# Patient Record
Sex: Male | Born: 1955
Health system: Southern US, Community
[De-identification: ages and names within clinical notes are randomized; demographics above are authoritative.]

## PROBLEM LIST (undated history)

## (undated) DIAGNOSIS — I119 Hypertensive heart disease without heart failure: Secondary | ICD-10-CM

## (undated) DIAGNOSIS — E785 Hyperlipidemia, unspecified: Secondary | ICD-10-CM

## (undated) DIAGNOSIS — G4733 Obstructive sleep apnea (adult) (pediatric): Secondary | ICD-10-CM

## (undated) DIAGNOSIS — Z87828 Personal history of other (healed) physical injury and trauma: Secondary | ICD-10-CM

## (undated) DIAGNOSIS — K219 Gastro-esophageal reflux disease without esophagitis: Secondary | ICD-10-CM

## (undated) DIAGNOSIS — E119 Type 2 diabetes mellitus without complications: Secondary | ICD-10-CM

## (undated) DIAGNOSIS — I2510 Atherosclerotic heart disease of native coronary artery without angina pectoris: Secondary | ICD-10-CM

## (undated) DIAGNOSIS — M1990 Unspecified osteoarthritis, unspecified site: Secondary | ICD-10-CM

## (undated) DIAGNOSIS — Z9989 Dependence on other enabling machines and devices: Secondary | ICD-10-CM

## (undated) HISTORY — DX: Personal history of other (healed) physical injury and trauma: Z87.828

## (undated) HISTORY — DX: Type 2 diabetes mellitus without complications: E11.9

## (undated) HISTORY — DX: Hyperlipidemia, unspecified: E78.5

## (undated) HISTORY — PX: REPLACEMENT TOTAL KNEE: SUR1224

---

## 1979-01-02 HISTORY — PX: BELOW KNEE LEG AMPUTATION: SUR23

## 1989-01-01 HISTORY — PX: ELBOW SURGERY: SHX618

## 2002-02-08 ENCOUNTER — Emergency Department (HOSPITAL_COMMUNITY): Admission: EM | Admit: 2002-02-08 | Discharge: 2002-02-08 | Payer: Self-pay | Admitting: Emergency Medicine

## 2006-10-04 ENCOUNTER — Ambulatory Visit (HOSPITAL_COMMUNITY): Admission: RE | Admit: 2006-10-04 | Discharge: 2006-10-04 | Payer: Self-pay | Admitting: Orthopaedic Surgery

## 2006-10-11 ENCOUNTER — Ambulatory Visit (HOSPITAL_COMMUNITY): Admission: RE | Admit: 2006-10-11 | Discharge: 2006-10-11 | Payer: Self-pay | Admitting: Orthopaedic Surgery

## 2009-07-08 ENCOUNTER — Emergency Department (HOSPITAL_COMMUNITY): Admission: EM | Admit: 2009-07-08 | Discharge: 2009-07-08 | Payer: Self-pay | Admitting: Emergency Medicine

## 2009-07-10 ENCOUNTER — Emergency Department (HOSPITAL_COMMUNITY): Admission: EM | Admit: 2009-07-10 | Discharge: 2009-07-10 | Payer: Self-pay | Admitting: Emergency Medicine

## 2010-05-24 ENCOUNTER — Encounter: Payer: Self-pay | Admitting: Orthopaedic Surgery

## 2010-07-16 ENCOUNTER — Encounter (HOSPITAL_COMMUNITY): Payer: Self-pay

## 2010-07-16 ENCOUNTER — Ambulatory Visit (HOSPITAL_COMMUNITY)
Admission: RE | Admit: 2010-07-16 | Discharge: 2010-07-16 | Disposition: A | Discharge status: Home / Self Care | Payer: Medicaid Other | Source: Ambulatory Visit | Attending: Internal Medicine | Admitting: Internal Medicine

## 2010-07-16 ENCOUNTER — Other Ambulatory Visit (HOSPITAL_COMMUNITY): Payer: Self-pay | Admitting: Internal Medicine

## 2010-07-16 DIAGNOSIS — R0602 Shortness of breath: Secondary | ICD-10-CM | POA: Insufficient documentation

## 2010-07-16 DIAGNOSIS — R05 Cough: Secondary | ICD-10-CM

## 2010-07-16 DIAGNOSIS — R059 Cough, unspecified: Secondary | ICD-10-CM | POA: Insufficient documentation

## 2010-07-19 ENCOUNTER — Emergency Department (HOSPITAL_COMMUNITY): Payer: Medicaid Other

## 2010-07-19 ENCOUNTER — Emergency Department (HOSPITAL_COMMUNITY)
Admission: EM | Admit: 2010-07-19 | Discharge: 2010-07-19 | Disposition: A | Discharge status: Home / Self Care | Payer: Medicaid Other | Attending: Emergency Medicine | Admitting: Emergency Medicine

## 2010-07-19 DIAGNOSIS — R05 Cough: Secondary | ICD-10-CM | POA: Insufficient documentation

## 2010-07-19 DIAGNOSIS — R059 Cough, unspecified: Secondary | ICD-10-CM | POA: Insufficient documentation

## 2010-07-19 DIAGNOSIS — J209 Acute bronchitis, unspecified: Secondary | ICD-10-CM | POA: Insufficient documentation

## 2010-07-19 LAB — DIFFERENTIAL
Basophils Absolute: 0 10*3/uL (ref 0.0–0.1)
Eosinophils Absolute: 0.1 10*3/uL (ref 0.0–0.7)
Lymphocytes Relative: 14 % (ref 12–46)
Neutro Abs: 9.1 10*3/uL — ABNORMAL HIGH (ref 1.7–7.7)
Neutrophils Relative %: 77 % (ref 43–77)

## 2010-07-19 LAB — GLUCOSE, CAPILLARY: Glucose-Capillary: 174 mg/dL — ABNORMAL HIGH (ref 70–99)

## 2010-07-19 LAB — CBC
MCHC: 34.8 g/dL (ref 30.0–36.0)
MCV: 85.7 fL (ref 78.0–100.0)
RDW: 12.7 % (ref 11.5–15.5)

## 2010-07-19 LAB — BASIC METABOLIC PANEL
Calcium: 8.3 mg/dL — ABNORMAL LOW (ref 8.4–10.5)
Chloride: 94 mEq/L — ABNORMAL LOW (ref 96–112)
Creatinine, Ser: 0.88 mg/dL (ref 0.4–1.5)
Sodium: 132 mEq/L — ABNORMAL LOW (ref 135–145)
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
## 2010-09-15 NOTE — Op Note (Signed)
NAME:  Carl Mccoy, Carl Mccoy NO.:  [id number]   MEDICAL RECORD NO.:  [id number]          PATIENT TYPE:  AMB   LOCATION:  DAY                           FACILITY:  [facility]   PHYSICIAN:  J. Darreld Mclean, M.D. DATE OF BIRTH:  1955/11/14   DATE OF PROCEDURE:  10/11/2006  DATE OF DISCHARGE:                               OPERATIVE REPORT   PREOPERATIVE DIAGNOSIS:  Tear left the medial meniscus.   POSTOPERATIVE DIAGNOSES:  1. Tear left the medial meniscus.  2. Degenerative joint disease.   PROCEDURE:  Operative arthroscopy of left knee with partial medial  meniscectomy.   ANESTHESIA:  General.   TOURNIQUET TIME:  25 minutes.   DRAINS:  None   SURGEON:  J. Darreld Mclean, M.D.   INDICATIONS:  The patient is a 55 year old male with pain and tenderness  in his left knee with giving way.  MRI  shows a tear of the posterior  horn of the medial meniscus.  He also has degenerative joint disease.  The patient is status post below-the-knee amputation on the right from  previous injury, many years ago.  The risks and imponderables of the  procedure are discussed with the patient, preoperatively, and he  appeared to understand and agreed to the procedure as outlined.   DESCRIPTION OF PROCEDURE:  The patient was seen in the holding area.  The left knee was then described at the correct surgical site.  I placed  a mark on his left knee, and he placed on his mark on his left knee.  I  talked to his family.  He was brought to the operating room and placed  supine on the operating room table.  He was given general anesthesia.  The leg holder and tourniquet placed, deflated left upper thigh.  The  patient prepped and draped in the usual manner.  We had a timeout  identifying Carl Mccoy as the patient and the left knee as the correct  surgical site.   Leg was elevated, wrapped circumferentially with esmarch bandage, and  tourniquet was inflated to 350 mmHg and esmarch bandage removed.   Inflow  cannula was inserted and lactated ringers instilled into the knee  medially.  Arthroscope inserted laterally, and the knee was  systematically examined.  Findings were suprapatellar pouch looked  normal; there were some grade 2 changes around the patellofemoral joint.  Medially he had grade 3-4 changes, particularly on the tear of the  posterior horn of the medial meniscus.  Anterior cruciate was loose, but  intact.  Laterally he had grade 2 changes.  The meniscus was intact  laterally.   Attention was directed to either side of the knee.  Using a meniscal  shaver and meniscal punch a good smooth contour was obtained.  Permanent  pictures were taken throughout the case.  Again, it should be noted that  he had some eburnation of bone and grade 4 changes in certain areas of  the tibial plateau posteriorly and on the left medially.  The knee was  irrigated with remaining part of lactated ringers.  The wound  was  reapproximated using 3-0  nylon in an interrupted vertical mattress  manner.  Marcaine 0.25% instilled in each portal.  Tourniquet deflated  after 25 minutes.  Sterile dressing applied, bulky dressing applied,  knee immobilizer applied.  Patient stood procedure well; and went to  recovery in good condition.  Given Percocet 7.5 for pain.  I will see  him in the office in approximately 10 days to 2 weeks.  Physical therapy  has been arranged.  If any difficulty he is to contact me as stated.           ______________________________  J. Darreld Mclean, M.D.     JWK/MEDQ  D:  10/11/2006  T:  10/11/2006  Job:  [id number]

## 2011-02-18 LAB — DIFFERENTIAL
Basophils Relative: 1
Eosinophils Relative: 4
Lymphocytes Relative: 14
Lymphs Abs: 1.2
Monocytes Absolute: 0.6
Monocytes Relative: 7

## 2011-02-18 LAB — URINALYSIS, ROUTINE W REFLEX MICROSCOPIC
Hgb urine dipstick: NEGATIVE
Protein, ur: NEGATIVE
Urobilinogen, UA: 0.2

## 2011-02-18 LAB — COMPREHENSIVE METABOLIC PANEL
Albumin: 3.5
BUN: 29 — ABNORMAL HIGH
Calcium: 8.9
GFR calc Af Amer: >60
Glucose, Bld: 167 — ABNORMAL HIGH
Sodium: 136
Total Protein: 6.2

## 2011-02-18 LAB — CBC
HCT: 39.4
Hemoglobin: 13.7
MCV: 84.3
RBC: 4.68
WBC: 8.6

## 2011-06-29 ENCOUNTER — Emergency Department (HOSPITAL_COMMUNITY): Payer: Medicare Other

## 2011-06-29 ENCOUNTER — Other Ambulatory Visit: Payer: Self-pay

## 2011-06-29 ENCOUNTER — Emergency Department (HOSPITAL_COMMUNITY)
Admission: EM | Admit: 2011-06-29 | Discharge: 2011-06-29 | Discharge status: Against Medical Advice | Payer: Medicare Other | Attending: Emergency Medicine | Admitting: Emergency Medicine

## 2011-06-29 ENCOUNTER — Encounter (HOSPITAL_COMMUNITY): Payer: Self-pay | Admitting: *Deleted

## 2011-06-29 DIAGNOSIS — E119 Type 2 diabetes mellitus without complications: Secondary | ICD-10-CM | POA: Insufficient documentation

## 2011-06-29 DIAGNOSIS — I10 Essential (primary) hypertension: Secondary | ICD-10-CM | POA: Insufficient documentation

## 2011-06-29 DIAGNOSIS — R079 Chest pain, unspecified: Secondary | ICD-10-CM | POA: Insufficient documentation

## 2011-06-29 LAB — CBC
HCT: 44.5 % (ref 39.0–52.0)
Hemoglobin: 15.1 g/dL (ref 13.0–17.0)
MCH: 29.7 pg (ref 26.0–34.0)
RBC: 5.08 MIL/uL (ref 4.22–5.81)
RDW: 12.5 % (ref 11.5–15.5)
WBC: 5.2 10*3/uL (ref 4.0–10.5)

## 2011-06-29 LAB — COMPREHENSIVE METABOLIC PANEL
AST: 16 U/L (ref 0–37)
CO2: 30 mEq/L (ref 19–32)
Chloride: 101 mEq/L (ref 96–112)
Glucose, Bld: 222 mg/dL — ABNORMAL HIGH (ref 70–99)

## 2011-06-29 LAB — POCT I-STAT TROPONIN I: Troponin i, poc: 0.02 ng/mL (ref 0.00–0.08)

## 2011-06-29 LAB — DIFFERENTIAL
Basophils Absolute: 0 10*3/uL (ref 0.0–0.1)
Basophils Relative: 1 % (ref 0–1)
Eosinophils Absolute: 0.3 10*3/uL (ref 0.0–0.7)
Eosinophils Relative: 6 % — ABNORMAL HIGH (ref 0–5)
Neutro Abs: 2.6 10*3/uL (ref 1.7–7.7)

## 2011-06-29 MED ORDER — NITROGLYCERIN 0.4 MG SL SUBL
0.4000 mg | SUBLINGUAL_TABLET | SUBLINGUAL | Status: DC | PRN
  Administered 2011-06-29: 0.4 mg via SUBLINGUAL
  Filled 2011-06-29: qty 25

## 2011-06-29 MED ORDER — ASPIRIN 81 MG PO CHEW
324.0000 mg | CHEWABLE_TABLET | Freq: Once | ORAL | Status: AC
  Administered 2011-06-29: 324 mg via ORAL
  Filled 2011-06-29: qty 4

## 2011-06-29 NOTE — ED Provider Notes (Signed)
History     CSN: [id number]  Arrival date & time 06/29/11  0450   First MD Initiated Contact with Patient 06/29/11 0502      Chief Complaint  Patient presents with  . Chest Pain    (Consider location/radiation/quality/duration/timing/severity/associated sxs/prior treatment) Patient is a 56 y.o. male presenting with chest pain. The history is provided by the patient (the patient complains of chest tightness since 1 AM today mild shortness of breath no sweating.). No language interpreter was used.  Chest Pain The chest pain began 3 - 5 hours ago. Chest pain occurs constantly. The chest pain is unchanged. Associated with: nothing. At its most intense, the pain is at 7/10. The pain is currently at 4/10. The severity of the pain is moderate. The quality of the pain is described as aching. The pain does not radiate. Exacerbated by: nothing. Pertinent negatives for primary symptoms include no fever, no fatigue, no syncope, no cough and no abdominal pain.  Pertinent negatives for associated symptoms include no claudication.  Pertinent negatives for past medical history include no seizures.     Past Medical History  Diagnosis Date  . Diabetes mellitus   . Hypertension     Past Surgical History  Procedure Date  . Below knee leg amputation     right BKA    No family history on file.  History  Substance Use Topics  . Smoking status: Former Games developer  . Smokeless tobacco: Not on file  . Alcohol Use: No      Review of Systems  Constitutional: Negative for fever and fatigue.  HENT: Negative for congestion, sinus pressure and ear discharge.   Eyes: Negative for discharge.  Respiratory: Negative for cough.   Cardiovascular: Positive for chest pain. Negative for claudication and syncope.  Gastrointestinal: Negative for abdominal pain and diarrhea.  Genitourinary: Negative for frequency and hematuria.  Musculoskeletal: Negative for back pain.  Skin: Negative for rash.  Neurological:  Negative for seizures and headaches.  Hematological: Negative.   Psychiatric/Behavioral: Negative for hallucinations.    Allergies  Review of patient's allergies indicates no known allergies.  Home Medications   Current Outpatient Rx  Name Route Sig Dispense Refill  . METFORMIN HCL 500 MG PO TABS Oral Take 500 mg by mouth 2 (two) times daily with a meal.    . UNKNOWN TO PATIENT  Blood pressure medication-pt does not know name or dose.      BP 111/61  Pulse 62  Temp(Src) 97.7 F (36.5 C) (Oral)  Resp 16  Ht 5\' 11"  (1.803 m)  Wt 260 lb (117.935 kg)  BMI 36.26 kg/m2  SpO2 96%  Physical Exam  Constitutional: He is oriented to person, place, and time. He appears well-developed.  HENT:  Head: Normocephalic and atraumatic.  Eyes: Conjunctivae and EOM are normal. No scleral icterus.  Neck: Neck supple. No thyromegaly present.  Cardiovascular: Normal rate and regular rhythm.  Exam reveals no gallop and no friction rub.   No murmur heard. Pulmonary/Chest: No stridor. He has no wheezes. He has no rales. He exhibits no tenderness.  Abdominal: He exhibits no distension. There is no tenderness. There is no rebound.  Musculoskeletal: Normal range of motion. He exhibits no edema.  Lymphadenopathy:    He has no cervical adenopathy.  Neurological: He is oriented to person, place, and time. Coordination normal.  Skin: No rash noted. No erythema.  Psychiatric: He has a normal mood and affect. His behavior is normal.    ED Course  Procedures (including critical care time)  Labs Reviewed  DIFFERENTIAL - Abnormal; Notable for the following:    Eosinophils Relative 6 (*)    All other components within normal limits  COMPREHENSIVE METABOLIC PANEL - Abnormal; Notable for the following:    Potassium 3.4 (*)    Glucose, Bld 222 (*)    Total Bilirubin 0.2 (*)    All other components within normal limits  CBC  POCT I-STAT TROPONIN I   Dg Chest Port 1 View  06/29/2011  *RADIOLOGY REPORT*   Clinical Data: Chest pain.  PORTABLE CHEST - 1 VIEW  Comparison: Chest radiograph performed 07/19/2010  Findings: The lungs are well-aerated.  Minimal bibasilar airspace opacities likely reflect atelectasis.  There is no evidence of pleural effusion or pneumothorax.  The cardiomediastinal silhouette is borderline normal in size.  No acute osseous abnormalities are seen.  IMPRESSION: Minimal bibasilar airspace opacities likely reflect atelectasis; lungs otherwise clear.  Original Report Authenticated By: Tonia Ghent, M.D.     1. Chest pain      Date: 06/29/2011  Rate:66  Rhythm: normal sinus rhythm  QRS Axis: left  Intervals: normal  ST/T Wave abnormalities: nonspecific ST changes  Pt has new inverted t waves in lead 3  Conduction Disutrbances:none  Narrative Interpretation:   Old EKG Reviewed: changes noted  The patient was told that the pain he had very well could be from his heart. He was told he should be admitted to the hospital for further testing. He was told he may have a heart attack. The patient understood this and decided he wanted to leave AMA  MDM  Chest pain possibly cardiac        Benny Lennert, MD 06/29/11 [phone]

## 2011-06-29 NOTE — ED Notes (Signed)
Administered one nitroglycerin tablet 0.4mg  SL, patient stated he is no longer in any pain.

## 2011-06-29 NOTE — ED Notes (Signed)
C/o left-sided chest pressure onset this morning approx 0100; states this awoke him from sleep.

## 2011-06-29 NOTE — Discharge Instructions (Signed)
Return to the hospital if any more discomfort.  See your md this week.

## 2011-07-22 NOTE — H&P (Signed)
  NTS SOAP Note  Vital Signs:  Vitals as of: 07/22/2011: Systolic 139: Diastolic 79: Heart Rate 69: Temp 96.81F: Height 62ft 11in: Weight 249Lbs 0 Ounces: OFC 0in: Respiratory Rate 0: O2 Saturation 0: Pain Level 0: BMI 35  BMI : 34.73 kg/m2  Subjective: This 80 Years 80 Months old Male presents forscreening TCS.  Denies any GI complaints.  No family h/o colon carcinoma.  Review of Symptoms:  Constitutional:unremarkable Head:unremarkable Eyes:unremarkable Nose/Mouth/Throat:unremarkable Cardiovascular:unremarkable Respiratory:dyspnea Gastrointestinal:unremarkable Genitourinary:frequency joint, neck pain Skin:unremarkable Hematolgic/Lymphatic:unremarkable Allergic/Immunologic:unremarkable   Past Medical History:Reviewed   Past Medical History  Surgical History: left leg amputation Medical Problems:  Diabetes, High Blood pressure Allergies: nkda Medications: metformin, glipizide, lisinopril   Social History:Reviewed   Social History  Preferred Language: English ([place]) Race:  White Ethnicity: Not Hispanic / Latino Age: 53 Years 3 Months Marital Status:  M Alcohol: unknown Recreational drug(s):  No   Smoking Status: Former smoker reviewed on 07/22/2011 Started Date: 05/03/1974 Stopped Date: 05/03/1998   Family History:Reviewed   Family History  Is there a family history WU:[id number], HTN    Objective Information: General:Well appearing, well nourished in no distress. Head:Atraumatic; no masses; no abnormalities Heart:RRR, no murmur or gallop.  Normal S1, S2.  No S3, S4.  Lungs:CTA bilaterally, no wheezes, rhonchi, rales.  Breathing unlabored. Abdomen:Soft, NT/ND, no HSM, no masses. deferred to procedure  Assessment:Need for screening TCS  Diagnosis &amp; Procedure: DiagnosisCode: V76.51, ProcedureCode: 82956,    Plan:Scheduled for TCS on 08/10/11.   Patient  Education:Alternative treatments to surgery were discussed with patient (and family).Risks and benefits  of procedure were fully explained to the patient (and family) who gave informed consent. Patient/family questions were addressed.  Follow-up:Pending Surgery

## 2011-08-06 ENCOUNTER — Encounter (HOSPITAL_COMMUNITY): Payer: Self-pay

## 2011-08-09 MED ORDER — SODIUM CHLORIDE 0.45 % IV SOLN
Freq: Once | INTRAVENOUS | Status: AC
  Administered 2011-08-10: 09:00:00 via INTRAVENOUS

## 2011-08-10 ENCOUNTER — Encounter (HOSPITAL_COMMUNITY)
Admission: RE | Disposition: A | Discharge status: Home / Self Care | Payer: Self-pay | Source: Ambulatory Visit | Attending: General Surgery

## 2011-08-10 ENCOUNTER — Ambulatory Visit (HOSPITAL_COMMUNITY)
Admission: RE | Admit: 2011-08-10 | Discharge: 2011-08-10 | Disposition: A | Discharge status: Home / Self Care | Payer: Medicare Other | Source: Ambulatory Visit | Attending: General Surgery | Admitting: General Surgery

## 2011-08-10 ENCOUNTER — Encounter (HOSPITAL_COMMUNITY): Payer: Self-pay | Admitting: *Deleted

## 2011-08-10 DIAGNOSIS — Z1211 Encounter for screening for malignant neoplasm of colon: Secondary | ICD-10-CM | POA: Insufficient documentation

## 2011-08-10 DIAGNOSIS — I10 Essential (primary) hypertension: Secondary | ICD-10-CM | POA: Insufficient documentation

## 2011-08-10 DIAGNOSIS — K5730 Diverticulosis of large intestine without perforation or abscess without bleeding: Secondary | ICD-10-CM | POA: Insufficient documentation

## 2011-08-10 DIAGNOSIS — Z79899 Other long term (current) drug therapy: Secondary | ICD-10-CM | POA: Insufficient documentation

## 2011-08-10 DIAGNOSIS — E119 Type 2 diabetes mellitus without complications: Secondary | ICD-10-CM | POA: Insufficient documentation

## 2011-08-10 DIAGNOSIS — D126 Benign neoplasm of colon, unspecified: Secondary | ICD-10-CM | POA: Insufficient documentation

## 2011-08-10 HISTORY — PX: COLONOSCOPY: SHX5424

## 2011-08-10 LAB — GLUCOSE, CAPILLARY: Glucose-Capillary: 147 mg/dL — ABNORMAL HIGH (ref 70–99)

## 2011-08-10 SURGERY — COLONOSCOPY
Anesthesia: Moderate Sedation

## 2011-08-10 MED ORDER — MIDAZOLAM HCL 5 MG/5ML IJ SOLN
INTRAMUSCULAR | Status: AC
  Filled 2011-08-10: qty 10

## 2011-08-10 MED ORDER — STERILE WATER FOR IRRIGATION IR SOLN
Status: DC | PRN
  Administered 2011-08-10: 09:00:00

## 2011-08-10 MED ORDER — MIDAZOLAM HCL 5 MG/5ML IJ SOLN
INTRAMUSCULAR | Status: DC | PRN
  Administered 2011-08-10: 4 mg via INTRAVENOUS

## 2011-08-10 MED ORDER — MEPERIDINE HCL 100 MG/ML IJ SOLN
INTRAMUSCULAR | Status: AC
  Filled 2011-08-10: qty 1

## 2011-08-10 MED ORDER — MEPERIDINE HCL 25 MG/ML IJ SOLN
INTRAMUSCULAR | Status: DC | PRN
  Administered 2011-08-10: 50 mg via INTRAVENOUS

## 2011-08-10 NOTE — Interval H&P Note (Signed)
History and Physical Interval Note:  08/10/2011 9:10 AM  Carl Mccoy  has presented today for surgery, with the diagnosis of Special screening for malignant neoplasms, colon   The various methods of treatment have been discussed with the patient and family. After consideration of risks, benefits and other options for treatment, the patient has consented to  Procedure(s) (LRB): COLONOSCOPY (N/A) as a surgical intervention .  The patients' history has been reviewed, patient examined, no change in status, stable for surgery.  I have reviewed the patients' chart and labs.  Questions were answered to the patient's satisfaction.     Franky Macho A

## 2011-08-10 NOTE — Op Note (Signed)
[facility] [address] [place], [place]  [address]  COLONOSCOPY PROCEDURE REPORT  PATIENT:  Carl Mccoy, Carl Mccoy  MR#:  [id number] BIRTHDATE:  02/13/1956, 55 yrs. old  GENDER:  male ENDOSCOPIST:  Franky Macho, MD REF. BY:  Karleen Hampshire, M.D. PROCEDURE DATE:  08/10/2011 PROCEDURE:  Colonoscopy with snare polypectomy ASA CLASS:  Class II INDICATIONS:  Screening MEDICATIONS:   Versed 4 mg IV, demerol 50 mg IV  DESCRIPTION OF PROCEDURE:   After the risks benefits and alternatives of the procedure were thoroughly explained, informed consent was obtained.  Digital rectal exam was performed and revealed no abnormalities.   The [id number] ([id number]) endoscope was introduced through the anus and advanced to the cecum, which was identified by both the appendix and ileocecal valve, without limitations.  The quality of the prep was adequate..  The instrument was then slowly withdrawn as the colon was fully examined. <<PROCEDUREIMAGES>>  FINDINGS:  Mild diverticulosis was found in the sigmoid colon.  A sessile polyp was found in the sigmoid colon, 25cm from the anus. (see image001 and image002).   Removed and fulgerated with snare cautery. No specimen was retrieved.  Retroflexed views in the rectum revealed no abnormalities.  The scope was then withdrawn from the cecum and the procedure completed. COMPLICATIONS:  None ENDOSCOPIC IMPRESSION: 1) Mild diverticulosis in the sigmoid colon 2) Sessile polyp in the sigmoid colon 3) Normal colonoscopy otherwise RECOMMENDATIONS:  REPEAT EXAM:  In 3 year(s) for Colonoscopy.  ______________________________ Franky Macho, MD  CC:  Karleen Hampshire, MD  n. Rosalie DoctorFranky Macho at 08/10/2011 09:31 AM  Sheryle Spray, [phone]

## 2011-08-10 NOTE — Discharge Instructions (Signed)
Colonoscopy Care After Read the instructions outlined below and refer to this sheet in the next few weeks. These discharge instructions provide you with general information on caring for yourself after you leave the hospital. Your doctor may also give you specific instructions. While your treatment has been planned according to the most current medical practices available, unavoidable complications occasionally occur. If you have any problems or questions after discharge, call your doctor. HOME CARE INSTRUCTIONS ACTIVITY:  You may resume your regular activity, but move at a slower pace for the next 24 hours.   Take frequent rest periods for the next 24 hours.   Walking will help get rid of the air and reduce the bloated feeling in your belly (abdomen).   No driving for 24 hours (because of the medicine (anesthesia) used during the test).   You may shower.   Do not sign any important legal documents or operate any machinery for 24 hours (because of the anesthesia used during the test).  NUTRITION:  Drink plenty of fluids.   You may resume your normal diet as instructed by your doctor.   Begin with a light meal and progress to your normal diet. Heavy or fried foods are harder to digest and may make you feel sick to your stomach (nauseated).   Avoid alcoholic beverages for 24 hours or as instructed.  MEDICATIONS:  You may resume your normal medications unless your doctor tells you otherwise.  WHAT TO EXPECT TODAY:  Some feelings of bloating in the abdomen.   Passage of more gas than usual.   Spotting of blood in your stool or on the toilet paper.  IF YOU HAD POLYPS REMOVED DURING THE COLONOSCOPY:  No aspirin products for 7 days or as instructed.   No alcohol for 7 days or as instructed.   Eat a soft diet for the next 24 hours.  FINDING OUT THE RESULTS OF YOUR TEST Not all test results are available during your visit. If your test results are not back during the visit, make an  appointment with your caregiver to find out the results. Do not assume everything is normal if you have not heard from your caregiver or the medical facility. It is important for you to follow up on all of your test results.  SEEK IMMEDIATE MEDICAL CARE IF:  You have more than a spotting of blood in your stool.   Your belly is swollen (abdominal distention).   You are nauseated or vomiting.   You have a fever.   You have abdominal pain or discomfort that is severe or gets worse throughout the day.  Document Released: 12/02/2003 Document Revised: 04/08/2011 Document Reviewed: 11/30/2007 ExitCare Patient Information 2012 [employer].   Diverticulosis Diverticulosis is a common condition that develops when small pouches (diverticula) form in the wall of the colon. The risk of diverticulosis increases with age. It happens more often in people who eat a low-fiber diet. Most individuals with diverticulosis have no symptoms. Those individuals with symptoms usually experience abdominal pain, constipation, or loose stools (diarrhea). HOME CARE INSTRUCTIONS   Increase the amount of fiber in your diet as directed by your caregiver or dietician. This may reduce symptoms of diverticulosis.   Your caregiver may recommend taking a dietary fiber supplement.   Drink at least 6 to 8 glasses of water each day to prevent constipation.   Try not to strain when you have a bowel movement.   Your caregiver may recommend avoiding nuts and seeds to prevent   complications, although this is still an uncertain benefit.   Only take over-the-counter or prescription medicines for pain, discomfort, or fever as directed by your caregiver.  FOODS WITH HIGH FIBER CONTENT INCLUDE:  Fruits. Apple, peach, pear, tangerine, raisins, prunes.   Vegetables. Brussels sprouts, asparagus, broccoli, cabbage, carrot, cauliflower, romaine lettuce, spinach, summer squash, tomato, winter squash, zucchini.   Starchy Vegetables.  Baked beans, kidney beans, lima beans, split peas, lentils, potatoes (with skin).   Grains. Whole wheat bread, brown rice, bran flake cereal, plain oatmeal, white rice, shredded wheat, bran muffins.  SEEK IMMEDIATE MEDICAL CARE IF:   You develop increasing pain or severe bloating.   You have an oral temperature above 102 F (38.9 C), not controlled by medicine.   You develop vomiting or bowel movements that are bloody or black.  Document Released: 01/15/2004 Document Revised: 04/08/2011 Document Reviewed: 09/17/2009 Exit[employer] Patient Information 2012 [employer]. 

## 2011-08-18 ENCOUNTER — Encounter (HOSPITAL_COMMUNITY): Payer: Self-pay | Admitting: General Surgery

## 2011-09-24 ENCOUNTER — Ambulatory Visit (HOSPITAL_COMMUNITY)
Admission: RE | Admit: 2011-09-24 | Discharge: 2011-09-24 | Disposition: A | Discharge status: Home / Self Care | Payer: Medicare Other | Source: Ambulatory Visit | Attending: Internal Medicine | Admitting: Internal Medicine

## 2011-09-24 ENCOUNTER — Other Ambulatory Visit (HOSPITAL_COMMUNITY): Payer: Self-pay | Admitting: Internal Medicine

## 2011-09-24 DIAGNOSIS — R05 Cough: Secondary | ICD-10-CM

## 2011-09-24 DIAGNOSIS — R059 Cough, unspecified: Secondary | ICD-10-CM | POA: Insufficient documentation

## 2012-09-27 ENCOUNTER — Encounter (HOSPITAL_COMMUNITY): Payer: Self-pay | Admitting: *Deleted

## 2012-09-27 ENCOUNTER — Emergency Department (HOSPITAL_COMMUNITY)
Admission: EM | Admit: 2012-09-27 | Discharge: 2012-09-27 | Disposition: A | Discharge status: Home / Self Care | Payer: Medicare Other | Attending: Emergency Medicine | Admitting: Emergency Medicine

## 2012-09-27 DIAGNOSIS — Z87891 Personal history of nicotine dependence: Secondary | ICD-10-CM | POA: Insufficient documentation

## 2012-09-27 DIAGNOSIS — E119 Type 2 diabetes mellitus without complications: Secondary | ICD-10-CM | POA: Insufficient documentation

## 2012-09-27 DIAGNOSIS — Y9389 Activity, other specified: Secondary | ICD-10-CM | POA: Insufficient documentation

## 2012-09-27 DIAGNOSIS — S81009A Unspecified open wound, unspecified knee, initial encounter: Secondary | ICD-10-CM | POA: Insufficient documentation

## 2012-09-27 DIAGNOSIS — S91009A Unspecified open wound, unspecified ankle, initial encounter: Secondary | ICD-10-CM | POA: Insufficient documentation

## 2012-09-27 DIAGNOSIS — I10 Essential (primary) hypertension: Secondary | ICD-10-CM | POA: Insufficient documentation

## 2012-09-27 DIAGNOSIS — Y929 Unspecified place or not applicable: Secondary | ICD-10-CM | POA: Insufficient documentation

## 2012-09-27 DIAGNOSIS — Z79899 Other long term (current) drug therapy: Secondary | ICD-10-CM | POA: Insufficient documentation

## 2012-09-27 DIAGNOSIS — T148XXA Other injury of unspecified body region, initial encounter: Secondary | ICD-10-CM

## 2012-09-27 DIAGNOSIS — W540XXA Bitten by dog, initial encounter: Secondary | ICD-10-CM | POA: Insufficient documentation

## 2012-09-27 LAB — GLUCOSE, CAPILLARY: Glucose-Capillary: 234 mg/dL — ABNORMAL HIGH (ref 70–99)

## 2012-09-27 MED ORDER — AMOXICILLIN-POT CLAVULANATE 875-125 MG PO TABS: 1.0000 | ORAL_TABLET | Freq: Two times a day (BID) | ORAL | Status: DC

## 2012-09-27 MED ORDER — TETANUS-DIPHTH-ACELL PERTUSSIS 5-2.5-18.5 LF-MCG/0.5 IM SUSP
0.5000 mL | Freq: Once | INTRAMUSCULAR | Status: AC
  Administered 2012-09-27: 0.5 mL via INTRAMUSCULAR
  Filled 2012-09-27: qty 0.5

## 2012-09-27 MED ORDER — AMOXICILLIN-POT CLAVULANATE 875-125 MG PO TABS
1.0000 | ORAL_TABLET | Freq: Once | ORAL | Status: AC
  Administered 2012-09-27: 1 via ORAL
  Filled 2012-09-27: qty 1

## 2012-09-27 NOTE — ED Notes (Signed)
Dog bite to right lower leg, occurred ~ 30 min PTA. Animal control is aware.

## 2012-09-27 NOTE — ED Notes (Signed)
CBG of 234  

## 2012-09-27 NOTE — ED Provider Notes (Signed)
History     CSN: [id number]  Arrival date & time 09/27/12  1229   First MD Initiated Contact with Patient 09/27/12 1328      Chief Complaint  Patient presents with  . Animal Bite    (Consider location/radiation/quality/duration/timing/severity/associated sxs/prior treatment) HPI Comments: Carl Mccoy is a 57 y.o. male who presents to the Emergency Department complaining of dog bite to the right lower leg that occurred just PTA.  States that he walked on to the front door of a home and the dog was tried up but bit him.  C/o puncture type wound to the lower leg that occurred through his pants.  He states that animal control was contacted and the dog is allegedly up to date on immunizations.  Patient denies bleeding, numbness or swelling to the extremity.  Pain with weight bearing.  Pt is unsure of last tetanus   The history is provided by the patient.    Past Medical History  Diagnosis Date  . Diabetes mellitus   . Hypertension     Past Surgical History  Procedure Laterality Date  . Below knee leg amputation      right BKA  . Colonoscopy  08/10/2011    Procedure: COLONOSCOPY;  Surgeon: Dalia Heading, MD;  [facility];  Service: Gastroenterology;  Laterality: N/A;    No family history on file.  History  Substance Use Topics  . Smoking status: Former Games developer  . Smokeless tobacco: Not on file  . Alcohol Use: No      Review of Systems  Constitutional: Negative for fever and chills.  Musculoskeletal: Negative for back pain, joint swelling and arthralgias.  Skin: Positive for wound.       Puncture wound   Neurological: Negative for dizziness, weakness and numbness.  Hematological: Does not bruise/bleed easily.  All other systems reviewed and are negative.    Allergies  Review of patient's allergies indicates no known allergies.  Home Medications   Current Outpatient Rx  Name  Route  Sig  Dispense  Refill  . glimepiride (AMARYL) 4 MG tablet   Oral   Take 8 mg by mouth daily.         [name] lisinopril-hydrochlorothiazide (PRINZIDE,ZESTORETIC) 20-12.5 MG per tablet   Oral   Take 1 tablet by mouth daily.         . metFORMIN (GLUCOPHAGE) 500 MG tablet   Oral   Take 500 mg by mouth 2 (two) times daily with a meal.         . oxyCODONE (ROXICODONE) 15 MG immediate release tablet   Oral   Take 15 mg by mouth every 6 (six) hours as needed. pain         . pantoprazole (PROTONIX) 40 MG tablet   Oral   Take 40 mg by mouth daily.           BP 146/117  Pulse 94  Temp(Src) 97.5 F (36.4 C) (Oral)  Resp 20  Ht 5\' 11"  (1.803 m)  Wt 260 lb (117.935 kg)  BMI 36.28 kg/m2  SpO2 99%  Physical Exam  Nursing note and vitals reviewed. Constitutional: He is oriented to person, place, and time. He appears well-developed and well-nourished. No distress.  HENT:  Head: Normocephalic and atraumatic.  Cardiovascular: Normal rate, regular rhythm, normal heart sounds and intact distal pulses.   No murmur heard. Pulmonary/Chest: Effort normal and breath sounds normal. No respiratory distress.  Musculoskeletal: He exhibits no edema and no tenderness.  Neurological: He is alert and oriented to person, place, and time. He exhibits normal muscle tone. Coordination normal.  Skin: Skin is warm. Laceration noted.  Puncture wound to lateral right lower leg.  No bleeding, swelling or obvious injury to deep tissue structures.  Distal sensation intact, DP pulse brisk.      ED Course  Procedures (including critical care time)  Labs Reviewed - No data to display No results found.   Wound was cleaned throughly, TDaP given, augmentin.  Wound was bandaged.     MDM    Officier Manson Passey with [facility] filed a report and confirmed that the dog is UTD on its vaccinations.    Pt agrees to keep wound clean, bandaged and agrees to return here if any signs of infection   Saulo Anthis L. Trisha Mangle, PA-C 10/01/12 1119

## 2012-09-27 NOTE — ED Notes (Signed)
Animal Control contacted to check on status of animal's rabies series. No answer, message left.

## 2012-10-02 NOTE — ED Provider Notes (Signed)
Medical screening examination/treatment/procedure(s) were performed by non-physician practitioner and as supervising physician I was immediately available for consultation/collaboration.  Adhira Jamil, MD 10/02/12 1414 

## 2013-02-01 ENCOUNTER — Encounter: Payer: Self-pay | Admitting: *Deleted

## 2013-02-01 DIAGNOSIS — G8929 Other chronic pain: Secondary | ICD-10-CM | POA: Insufficient documentation

## 2013-02-01 DIAGNOSIS — I10 Essential (primary) hypertension: Secondary | ICD-10-CM | POA: Insufficient documentation

## 2013-02-01 DIAGNOSIS — E119 Type 2 diabetes mellitus without complications: Secondary | ICD-10-CM | POA: Insufficient documentation

## 2013-02-01 DIAGNOSIS — E669 Obesity, unspecified: Secondary | ICD-10-CM

## 2013-02-02 ENCOUNTER — Encounter: Payer: Self-pay | Admitting: Cardiology

## 2013-02-02 ENCOUNTER — Encounter: Payer: Self-pay | Admitting: *Deleted

## 2013-02-02 ENCOUNTER — Ambulatory Visit (INDEPENDENT_AMBULATORY_CARE_PROVIDER_SITE_OTHER): Payer: Medicare Other | Admitting: Cardiology

## 2013-02-02 ENCOUNTER — Other Ambulatory Visit: Payer: Self-pay | Admitting: Cardiology

## 2013-02-02 VITALS — BP 190/103 | HR 88 | Ht 71.5 in | Wt 263.0 lb

## 2013-02-02 DIAGNOSIS — E119 Type 2 diabetes mellitus without complications: Secondary | ICD-10-CM

## 2013-02-02 DIAGNOSIS — R0609 Other forms of dyspnea: Secondary | ICD-10-CM

## 2013-02-02 DIAGNOSIS — Z01810 Encounter for preprocedural cardiovascular examination: Secondary | ICD-10-CM

## 2013-02-02 DIAGNOSIS — I10 Essential (primary) hypertension: Secondary | ICD-10-CM

## 2013-02-02 DIAGNOSIS — R072 Precordial pain: Secondary | ICD-10-CM

## 2013-02-02 DIAGNOSIS — E782 Mixed hyperlipidemia: Secondary | ICD-10-CM

## 2013-02-02 DIAGNOSIS — G4733 Obstructive sleep apnea (adult) (pediatric): Secondary | ICD-10-CM

## 2013-02-02 NOTE — Assessment & Plan Note (Signed)
Progressive as outlined above and also associated with intermittent atypical chest pain. ECG is nonspecific. He has a substantially increased cardiac risk factor profile including impressive family history of premature CAD, type 2 diabetes mellitus, uncontrolled hypertension, and hyperlipidemia. He reports a reassuring stress test a little over a year ago with [facility], records not available. Symptoms have progressed since that time. We discussed the risk and benefits of diagnostic cardiac catheterization and this will be arranged in the near future.

## 2013-02-02 NOTE — Assessment & Plan Note (Signed)
On fenofibrate, high triglycerides noted most recently.

## 2013-02-02 NOTE — Assessment & Plan Note (Signed)
He reports compliance with CPAP. 

## 2013-02-02 NOTE — Assessment & Plan Note (Signed)
Poorly controlled today, complicated by obesity and OSA. He reports compliance with medications. Will likely need further up titration.
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
## 2013-02-02 NOTE — Patient Instructions (Addendum)
Continue all current medications. Your physician has requested that you have a cardiac catheterization. Cardiac catheterization is used to diagnose and/or treat various heart conditions. Doctors may recommend this procedure for a number of different reasons. The most common reason is to evaluate chest pain. Chest pain can be a symptom of coronary artery disease (CAD), and cardiac catheterization can show whether plaque is narrowing or blocking your heart's arteries. This procedure is also used to evaluate the valves, as well as measure the blood flow and oxygen levels in different parts of your heart. For further information please visit www.cardiosmart.org. Please follow instruction sheet, as given. Follow up will be given at time of discharge from procedure.   

## 2013-02-02 NOTE — Assessment & Plan Note (Signed)
On oral therapy, followed by Dr. Regino Schultze.

## 2013-02-06 ENCOUNTER — Encounter (HOSPITAL_COMMUNITY): Payer: Self-pay | Admitting: Pharmacy Technician

## 2013-02-07 NOTE — Addendum Note (Signed)
Addended by: Thompson Grayer on: 02/07/2013 09:59 AM   Modules accepted: Orders

## 2013-02-08 ENCOUNTER — Encounter (HOSPITAL_COMMUNITY): Payer: Self-pay | Admitting: General Practice

## 2013-02-08 ENCOUNTER — Ambulatory Visit (HOSPITAL_COMMUNITY)
Admission: RE | Admit: 2013-02-08 | Discharge: 2013-02-09 | Disposition: A | Discharge status: Home / Self Care | Payer: Medicare Other | Source: Ambulatory Visit | Attending: Cardiovascular Disease | Admitting: Cardiovascular Disease

## 2013-02-08 ENCOUNTER — Encounter (HOSPITAL_COMMUNITY)
Admission: RE | Disposition: A | Discharge status: Home / Self Care | Payer: Self-pay | Source: Ambulatory Visit | Attending: Cardiovascular Disease

## 2013-02-08 DIAGNOSIS — S88119A Complete traumatic amputation at level between knee and ankle, unspecified lower leg, initial encounter: Secondary | ICD-10-CM | POA: Insufficient documentation

## 2013-02-08 DIAGNOSIS — I10 Essential (primary) hypertension: Secondary | ICD-10-CM | POA: Insufficient documentation

## 2013-02-08 DIAGNOSIS — E119 Type 2 diabetes mellitus without complications: Secondary | ICD-10-CM | POA: Insufficient documentation

## 2013-02-08 DIAGNOSIS — I2510 Atherosclerotic heart disease of native coronary artery without angina pectoris: Secondary | ICD-10-CM

## 2013-02-08 DIAGNOSIS — E782 Mixed hyperlipidemia: Secondary | ICD-10-CM | POA: Insufficient documentation

## 2013-02-08 DIAGNOSIS — R404 Transient alteration of awareness: Secondary | ICD-10-CM | POA: Insufficient documentation

## 2013-02-08 DIAGNOSIS — G4733 Obstructive sleep apnea (adult) (pediatric): Secondary | ICD-10-CM | POA: Insufficient documentation

## 2013-02-08 DIAGNOSIS — I209 Angina pectoris, unspecified: Secondary | ICD-10-CM | POA: Insufficient documentation

## 2013-02-08 HISTORY — DX: Morbid (severe) obesity due to excess calories: E66.01

## 2013-02-08 HISTORY — DX: Atherosclerotic heart disease of native coronary artery without angina pectoris: I25.10

## 2013-02-08 HISTORY — DX: Obstructive sleep apnea (adult) (pediatric): G47.33

## 2013-02-08 HISTORY — DX: Gastro-esophageal reflux disease without esophagitis: K21.9

## 2013-02-08 HISTORY — PX: LEFT HEART CATHETERIZATION WITH CORONARY ANGIOGRAM: SHX5451

## 2013-02-08 HISTORY — DX: Unspecified osteoarthritis, unspecified site: M19.90

## 2013-02-08 HISTORY — DX: Dependence on other enabling machines and devices: Z99.89

## 2013-02-08 HISTORY — PX: PERCUTANEOUS CORONARY STENT INTERVENTION (PCI-S): SHX5485

## 2013-02-08 LAB — BASIC METABOLIC PANEL
Calcium: 9.3 mg/dL (ref 8.4–10.5)
GFR calc Af Amer: >90 mL/min (ref >90)
GFR calc non Af Amer: >90 mL/min (ref >90)
Glucose, Bld: 218 mg/dL — ABNORMAL HIGH (ref 70–99)
Potassium: 3.9 mEq/L (ref 3.5–5.1)
Sodium: 137 mEq/L (ref 135–145)

## 2013-02-08 LAB — GLUCOSE, CAPILLARY
Glucose-Capillary: 201 mg/dL — ABNORMAL HIGH (ref 70–99)
Glucose-Capillary: 135 mg/dL — ABNORMAL HIGH (ref 70–99)
Glucose-Capillary: 240 mg/dL — ABNORMAL HIGH (ref 70–99)

## 2013-02-08 LAB — CBC
HCT: 41.9 % (ref 39.0–52.0)
Hemoglobin: 14.9 g/dL (ref 13.0–17.0)
MCH: 30 pg (ref 26.0–34.0)
MCHC: 35.6 g/dL (ref 30.0–36.0)
MCV: 84.5 fL (ref 78.0–100.0)
Platelets: 185 10*3/uL (ref 150–400)
RBC: 4.96 MIL/uL (ref 4.22–5.81)
RDW: 12.8 % (ref 11.5–15.5)
WBC: 5.9 10*3/uL (ref 4.0–10.5)

## 2013-02-08 LAB — PROTIME-INR
INR: 0.96 (ref 0.00–1.49)
Prothrombin Time: 12.6 seconds (ref 11.6–15.2)

## 2013-02-08 SURGERY — LEFT HEART CATHETERIZATION WITH CORONARY ANGIOGRAM
Anesthesia: LOCAL

## 2013-02-08 MED ORDER — SODIUM CHLORIDE 0.9 % IJ SOLN: 3.0000 mL | INTRAMUSCULAR | Status: DC | PRN

## 2013-02-08 MED ORDER — SODIUM CHLORIDE 0.9 % IV SOLN: 250.0000 mL | INTRAVENOUS | Status: DC | PRN

## 2013-02-08 MED ORDER — HYDROCHLOROTHIAZIDE 12.5 MG PO CAPS
12.5000 mg | ORAL_CAPSULE | Freq: Every day | ORAL | Status: DC
  Administered 2013-02-09: 12.5 mg via ORAL
  Filled 2013-02-08: qty 1

## 2013-02-08 MED ORDER — LISINOPRIL-HYDROCHLOROTHIAZIDE 20-12.5 MG PO TABS
1.0000 | ORAL_TABLET | Freq: Every day | ORAL | Status: DC
Start: 2013-02-08 — End: 2013-02-08

## 2013-02-08 MED ORDER — SODIUM CHLORIDE 0.9 % IV SOLN
1.7500 mg/kg/h | INTRAVENOUS | Status: DC
  Filled 2013-02-08: qty 250

## 2013-02-08 MED ORDER — ASPIRIN 325 MG PO TABS
325.0000 mg | ORAL_TABLET | Freq: Every day | ORAL | Status: DC
  Administered 2013-02-09: 10:00:00 325 mg via ORAL
  Filled 2013-02-08 (×2): qty 1

## 2013-02-08 MED ORDER — LIDOCAINE HCL (PF) 1 % IJ SOLN
INTRAMUSCULAR | Status: AC
  Filled 2013-02-08: qty 30

## 2013-02-08 MED ORDER — FENOFIBRATE 160 MG PO TABS
160.0000 mg | ORAL_TABLET | Freq: Every day | ORAL | Status: DC
  Administered 2013-02-08 – 2013-02-09 (×2): 160 mg via ORAL
  Filled 2013-02-08 (×2): qty 1

## 2013-02-08 MED ORDER — OXYCODONE HCL 5 MG PO TABS: 15.0000 mg | ORAL_TABLET | Freq: Four times a day (QID) | ORAL | Status: DC | PRN

## 2013-02-08 MED ORDER — SODIUM CHLORIDE 0.9 % IV SOLN
INTRAVENOUS | Status: DC
  Administered 2013-02-08: 11:00:00 via INTRAVENOUS

## 2013-02-08 MED ORDER — SODIUM CHLORIDE 0.9 % IJ SOLN: 3.0000 mL | Freq: Two times a day (BID) | INTRAMUSCULAR | Status: DC

## 2013-02-08 MED ORDER — ASPIRIN 81 MG PO CHEW
81.0000 mg | CHEWABLE_TABLET | ORAL | Status: AC
  Administered 2013-02-08: 81 mg via ORAL

## 2013-02-08 MED ORDER — FENTANYL CITRATE 0.05 MG/ML IJ SOLN
INTRAMUSCULAR | Status: AC
  Filled 2013-02-08: qty 2

## 2013-02-08 MED ORDER — SODIUM CHLORIDE 0.9 % IV SOLN: 1.0000 mL/kg/h | INTRAVENOUS | Status: AC

## 2013-02-08 MED ORDER — INSULIN ASPART 100 UNIT/ML ~~LOC~~ SOLN
0.0000 [IU] | Freq: Three times a day (TID) | SUBCUTANEOUS | Status: DC
  Administered 2013-02-09: 09:00:00 3 [IU] via SUBCUTANEOUS

## 2013-02-08 MED ORDER — HYDROMORPHONE HCL PF 2 MG/ML IJ SOLN
INTRAMUSCULAR | Status: AC
  Filled 2013-02-08: qty 1

## 2013-02-08 MED ORDER — INSULIN GLARGINE 100 UNIT/ML ~~LOC~~ SOLN
70.0000 [IU] | Freq: Every day | SUBCUTANEOUS | Status: DC
  Administered 2013-02-08: 22:00:00 70 [IU] via SUBCUTANEOUS
  Filled 2013-02-08 (×2): qty 0.7

## 2013-02-08 MED ORDER — ONDANSETRON HCL 4 MG/2ML IJ SOLN: 4.0000 mg | Freq: Four times a day (QID) | INTRAMUSCULAR | Status: DC | PRN

## 2013-02-08 MED ORDER — CLOPIDOGREL BISULFATE 75 MG PO TABS
75.0000 mg | ORAL_TABLET | Freq: Every day | ORAL | Status: DC
  Administered 2013-02-09: 09:00:00 75 mg via ORAL
  Filled 2013-02-08: qty 1

## 2013-02-08 MED ORDER — CLOPIDOGREL BISULFATE 300 MG PO TABS
ORAL_TABLET | ORAL | Status: AC
  Filled 2013-02-08: qty 1

## 2013-02-08 MED ORDER — NITROGLYCERIN 0.2 MG/ML ON CALL CATH LAB
INTRAVENOUS | Status: AC
  Filled 2013-02-08: qty 1

## 2013-02-08 MED ORDER — DIPHENHYDRAMINE HCL 50 MG/ML IJ SOLN
INTRAMUSCULAR | Status: AC
  Filled 2013-02-08: qty 1

## 2013-02-08 MED ORDER — BIVALIRUDIN 250 MG IV SOLR
INTRAVENOUS | Status: AC
  Filled 2013-02-08: qty 250

## 2013-02-08 MED ORDER — MIDAZOLAM HCL 2 MG/2ML IJ SOLN
INTRAMUSCULAR | Status: AC
  Filled 2013-02-08: qty 2

## 2013-02-08 MED ORDER — LISINOPRIL 20 MG PO TABS
20.0000 mg | ORAL_TABLET | Freq: Every day | ORAL | Status: DC
  Administered 2013-02-09: 20 mg via ORAL
  Filled 2013-02-08: qty 1

## 2013-02-08 MED ORDER — PANTOPRAZOLE SODIUM 40 MG PO TBEC
40.0000 mg | DELAYED_RELEASE_TABLET | Freq: Every day | ORAL | Status: DC
  Administered 2013-02-08 – 2013-02-09 (×2): 40 mg via ORAL
  Filled 2013-02-08 (×2): qty 1

## 2013-02-08 MED ORDER — HEPARIN (PORCINE) IN NACL 2-0.9 UNIT/ML-% IJ SOLN
INTRAMUSCULAR | Status: AC
  Filled 2013-02-08: qty 1000

## 2013-02-08 MED ORDER — LIVING WELL WITH DIABETES BOOK
Freq: Once | Status: AC
  Administered 2013-02-08: 22:00:00
  Filled 2013-02-08: qty 1

## 2013-02-08 MED ORDER — ACETAMINOPHEN 325 MG PO TABS: 650.0000 mg | ORAL_TABLET | ORAL | Status: DC | PRN

## 2013-02-08 NOTE — Interval H&P Note (Signed)
Cath Lab Visit (complete for each Cath Lab visit)  Clinical Evaluation Leading to the Procedure:   ACS: no  Non-ACS:    Anginal Classification: CCS III  Anti-ischemic medical therapy: No Therapy  Non-Invasive Test Results: No non-invasive testing performed  Prior CABG: No previous CABG  History and Physical Interval Note:  02/08/2013 12:44 PM  Gerald Stabs  has presented today for surgery, with the diagnosis of ANGINA  The various methods of treatment have been discussed with the patient and family. After consideration of risks, benefits and other options for treatment, the patient has consented to  Procedure(s): LEFT HEART CATHETERIZATION WITH CORONARY ANGIOGRAM (N/A) as a surgical intervention .  The patient's history has been reviewed, patient examined, no change in status, stable for surgery.  I have reviewed the patient's chart and labs.  Questions were answered to the patient's satisfaction.     [name]

## 2013-02-08 NOTE — CV Procedure (Signed)
Cardiac Catheterization Procedure Note  Name: Carl Mccoy MRN: [id number] DOB: 1955-07-31  Procedure: Left Heart Cath, Selective Coronary Angiography, LV angiography,  PTCA/Stent of mid and distal RCA, PTCA and stenting of the proximal LAD  Indication:  CCS Class 3 angina, diabetes, HTN, obesity, strong fam hx premature CAD. Marked dyspnea and diaphoresis with minimal/light exertion (CCS 3).   Diagnostic Procedure Details: The right groin was prepped, draped, and anesthetized with 1% lidocaine. Using the modified Seldinger technique, a 5 French sheath was introduced into the right femoral artery. Standard Judkins catheters were used for selective coronary angiography and left ventriculography. Catheter exchanges were performed over a wire.  The diagnostic procedure was well-tolerated without immediate complications.  PROCEDURAL FINDINGS Hemodynamics: AO 122/80 LV 115/21  Coronary angiography: Coronary dominance: right  Left mainstem: Arises from left cusp and divides into the LAD and LCx. Widely patent.   Left anterior descending (LAD): 80% proximal stenosis, diffuse mid-LAD stenosis 40-50%, wraps around LV apex. D1 80% ostial stenosis, D2 60% ostial stenosis.   Left circumflex (LCx): normal caliber, acute angulation from the left main. Diffuse 30-40% stenosis in the mid-vessel. Ramus intermedius is patent.   Right coronary artery (RCA): Multiple areas of segmental disease noted. This is a dominant vessel. Mid RCA has a 80% eccentric stenosis. Distal RCA has a 95% stenosis. PDA with an 80% ostial stenosis. PLA is patent with mild diffuse disease.   Left ventriculography: there is mild inferior wall hypokinesis with an LVEF estimated at 50%. No significant MR.   PCI Procedure Note:  Following the diagnostic procedure, the decision was made to proceed with PCI. The patient has CCS Class 3 symptoms with severe 2 vessel disease including proximal LAD stenosis. He was loaded with  plavix 600mg . The sheath was upsized to a 6 [place]. Weight-based bivalirudin was given for anticoagulation. Once a therapeutic ACT was achieved, a 6 [place] JR4 guide catheter was inserted.  A prowater coronary guidewire was used to cross the lesions in the RCA.  The lesions were predilated with a 2.5 mm balloon.  Stent delivery was extremely difficult. I initially attempted a 2.75 x 20 mm Promus Premier DES but could not get it across the first lesion despite aggressive guide catheter manipulation. Ultimately I had to change out to an AL-1 guide and a cougar wire. After a long attempt, I was finally able to pass the stent into the distal RCA and it was deployed at 14 atm. For the more proximal lesion, a 2.75x12 mm Promus DES was used and deployed at 14 atm. Both stent site were post-dilated with a 3.0 mm Empira Jerome balloon to max pressure of 16 atm. Following PCI, there was 0% residual stenosis and TIMI-3 flow at both sites.  Attention was then turned to the LAD. An XB-LAD 3.5 cm guide was used. A cougar guidewire crossed the lesion which was dilated with the same 2.5 mm balloon. The lesion was then stented with a 3.5x20 mm Promus Premier DES deployed at 14 atm. The stent was post-dilated with a 3.75 mm [place] Empira to 16 atm. There was 0% residual stenosis and TIMI-3 flow at the completion of the procedure. The diagonal branch was jailed at the completion, but it had been severely diseased at the beginning and was small-medium in caliber.   2 perclose devices were attempted but were unsuccessful. The sheath was upsized to 7Fr and manual pressure will be used for hemostasis.   The patient tolerated the PCI procedure well.  There were no immediate procedural complications.  The patient was transferred to the post catheterization recovery area for further monitoring.  PCI Data: Lesion 1 Vessel - RCA/Segment - mid Percent Stenosis (pre)  80 TIMI-flow 3 Stent 2.75x12 mm Promus DES Percent Stenosis (post)  0 TIMI-flow (post) 3  Lesion 2: Vessel - RCA/Segment - distal Percent Stenosis (pre)  95 TIMI-flow 3 Stent 2.75x20 Promus DES Percent Stenosis (post) 0 TIMI-flow (post) 3  Lesion 3 Vessel - LAD/Segment - prox Percent Stenosis (pre)  80 TIMI-flow 3 Stent 3.5x20 mm Promus DES Percent Stenosis (post) 0 TIMI-flow (post) 3  Final Conclusions:   1. Severe LAD and RCA stenosis with successful 2 vessel PCI as above 2. Patent LCx with nonobstructive disease 3. Mild segmental LV dysfunction  Recommendations: ASA/plavix 12 months, aggressive medical therapy.  [name] 02/08/2013, 2:44 PM
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
## 2013-02-09 ENCOUNTER — Encounter (HOSPITAL_COMMUNITY): Payer: Self-pay | Admitting: Nurse Practitioner

## 2013-02-09 DIAGNOSIS — I209 Angina pectoris, unspecified: Secondary | ICD-10-CM

## 2013-02-09 LAB — CBC
HCT: 40 % (ref 39.0–52.0)
Hemoglobin: 14.1 g/dL (ref 13.0–17.0)
MCHC: 35.3 g/dL (ref 30.0–36.0)
RBC: 4.67 MIL/uL (ref 4.22–5.81)
RDW: 13.1 % (ref 11.5–15.5)
WBC: 8.3 10*3/uL (ref 4.0–10.5)

## 2013-02-09 LAB — BASIC METABOLIC PANEL
BUN: 26 mg/dL — ABNORMAL HIGH (ref 6–23)
Chloride: 100 mEq/L (ref 96–112)
GFR calc Af Amer: >90 mL/min (ref >90)
Glucose, Bld: 170 mg/dL — ABNORMAL HIGH (ref 70–99)
Potassium: 3.7 mEq/L (ref 3.5–5.1)

## 2013-02-09 LAB — GLUCOSE, CAPILLARY: Glucose-Capillary: 170 mg/dL — ABNORMAL HIGH (ref 70–99)

## 2013-02-09 MED ORDER — NITROGLYCERIN 0.4 MG SL SUBL: 0.4000 mg | SUBLINGUAL_TABLET | SUBLINGUAL | Status: DC | PRN

## 2013-02-09 MED ORDER — METFORMIN HCL 500 MG PO TABS: 500.0000 mg | ORAL_TABLET | Freq: Two times a day (BID) | ORAL | Status: DC

## 2013-02-09 MED ORDER — ROSUVASTATIN CALCIUM 5 MG PO TABS: 5.0000 mg | ORAL_TABLET | Freq: Every day | ORAL | Status: DC

## 2013-02-09 MED ORDER — ASPIRIN 81 MG PO TABS: 81.0000 mg | ORAL_TABLET | Freq: Every day | ORAL | Status: DC

## 2013-02-09 MED ORDER — CLOPIDOGREL BISULFATE 75 MG PO TABS: 75.0000 mg | ORAL_TABLET | Freq: Every day | ORAL | Status: DC

## 2013-02-09 NOTE — Progress Notes (Signed)
CARDIAC REHAB PHASE I   PRE:  Rate/Rhythm: 65 SR  BP:  Supine:   Sitting: 143/85  Standing:    SaO2: 96  MODE:  Ambulation: 250 ft   POST:  Rate/Rhythm: 97 SR  BP:  Supine:   Sitting: 178/79  Standing:    SaO2: 100 Patient tolerated well without angina symptoms.  Prosthesis limited patient in walking smoothly.  He did not have his walking/work shoes from home and this limited him from walking a long distance.  Blood pressure was elevated pre and post exercise.  Education completed, patient was very receptive to lifestyle changes, referred to [facility] CRP II. [id number] Cindra Eves RN, BSN 02/09/2013 7:11 AM

## 2013-02-09 NOTE — Progress Notes (Signed)
Patient Name: Carl Mccoy Date of Encounter: 02/09/2013    Principal Problem:   Angina, class III Active Problems:   Essential hypertension, benign   Type 2 diabetes mellitus   Mixed hyperlipidemia   Obstructive sleep apnea   SUBJECTIVE  No chest pain or sob overnight.  No groin complaints.  CURRENT MEDS . aspirin  325 mg Oral Daily  . clopidogrel  75 mg Oral Q breakfast  . fenofibrate  160 mg Oral Daily  . lisinopril  20 mg Oral Daily   And  . hydrochlorothiazide  12.5 mg Oral Daily  . insulin aspart  0-15 Units Subcutaneous TID WC  . insulin glargine  70 Units Subcutaneous QHS  . pantoprazole  40 mg Oral Daily  . sodium chloride  3 mL Intravenous Q12H    OBJECTIVE  Filed Vitals:   02/08/13 1918 02/08/13 2000 02/09/13 0022 02/09/13 0601  BP: 116/70 121/69 103/44 148/80  Pulse: 76 69 69 68  Temp: 97.6 F (36.4 C)  97.6 F (36.4 C) 97.2 F (36.2 C)  TempSrc: Oral  Oral Axillary  Resp: 17 18 15 17   Height:      Weight:   264 lb 5.3 oz (119.9 kg)   SpO2: 94% 96% 99% 97%    Intake/Output Summary (Last 24 hours) at 02/09/13 0707 Last data filed at 02/09/13 0345  Gross per 24 hour  Intake 1028.8 ml  Output    600 ml  Net  428.8 ml   Filed Weights   02/08/13 1024 02/09/13 0022  Weight: 268 lb (121.564 kg) 264 lb 5.3 oz (119.9 kg)    PHYSICAL EXAM  General: Pleasant, NAD. Neuro: Alert and oriented X 3. Moves all extremities spontaneously. Psych: Normal affect. HEENT:  Normal  Neck: Supple without bruits or JVD. Lungs:  Resp regular and unlabored, CTA. Heart: RRR no s3, s4, or murmurs. Abdomen: Soft, non-tender, non-distended, BS + x 4.  Extremities: No clubbing, cyanosis or edema. R BKA.  DP (L)/PT (L)/Radials 2+.  R groin cath site ecchymotic w/o bleeding, bruit, hematoma.  Accessory Clinical Findings  CBC  Recent Labs  02/08/13 1008 02/09/13 0600  WBC 5.9 8.3  HGB 14.9 14.1  HCT 41.9 40.0  MCV 84.5 85.7  PLT 185 162   Basic Metabolic  Panel  Recent Labs  16/10/96 1008 02/09/13 0600  NA 137 137  K 3.9 3.7  CL 99 100  CO2 26 26  GLUCOSE 218* 170*  BUN 22 26*  CREATININE 0.67 0.77  CALCIUM 9.3 8.7   TELE  sb/rsr  ECG  Rsr, 65, no acute st/t changes.  Radiology/Studies  No results found.  ASSESSMENT AND PLAN  1.  USA/CAD:  S/p PCI/DES to the RCA x 2, and LAD x 1 yesterday.  No chest pain or sob overnight.  Cont asa, plavix.  Add statin.  Cardiac rehab this AM and then DC.  2.  HTN:  Stable.  Cont acei/hctz.  3.  HL/HTG:  On firbrate @ home.  Intolerant to lipitor.  Willing to try another agent.  Will try low dose crestor @ d/c.  4.  DM:  Cont lantus.  Resume metformin in 48 hrs.  5.  OSA:  Desaturated overnight req O2.  Usually wears CPAP @ home - resume.  6.  Morbid Obesity:  Cardiac rehab to see for exercise, nutrition counseling.  Signed, Nicolasa Ducking NP  Patient seen, examined. Available data reviewed. Agree with findings, assessment, and plan as outlined by Thayer Ohm  Brion Aliment, NP. The patient is stable post-PCI. We had a lengthy discussion about the importance of medication adherence and lifestyle modification. His right groin site has a small amount of ecchymoses without hematoma. He will follow-up with Dr Diona Browner.   [name], M.D. 02/09/2013 9:49 AM

## 2013-02-09 NOTE — Discharge Summary (Addendum)
Discharge Summary   Patient ID: Carl Mccoy,  MRN: [id number], DOB/AGE: October 14, 1955 57 y.o.  Admit date: 02/08/2013 Discharge date: 02/09/2013  Primary Care Provider: Kirk Ruths Primary Cardiologist: Ival Bible, MD   Discharge Diagnoses Principal Problem:   Angina, class III  **s/p Cath/PCI/DES to the RCA x 2, and LAD x 1, this admission.  Active Problems:   Essential hypertension, benign   Type 2 diabetes mellitus   Mixed hyperlipidemia   Obstructive sleep apnea  **On CPAP.  Allergies No Known Allergies  Procedures  Cardiac Catheterization and Percutaneous Coronary Intervention 10.9.2014  Hemodynamics: AO 122/80 LV 115/21  Coronary angiography: Coronary dominance: right  Left mainstem: Arises from left cusp and divides into the LAD and LCx. Widely patent.   Left anterior descending (LAD): 80% proximal stenosis, diffuse mid-LAD stenosis 40-50%, wraps around LV apex. D1 80% ostial stenosis, D2 60% ostial stenosis.     **The proximal LAD was successfully stented using a 3.5x20 mm Promus DES.**  Left circumflex (LCx): normal caliber, acute angulation from the left main. Diffuse 30-40% stenosis in the mid-vessel. Ramus intermedius is patent.   Right coronary artery (RCA): Multiple areas of segmental disease noted. This is a dominant vessel. Mid RCA has a 80% eccentric stenosis. Distal RCA has a 95% stenosis. PDA with an 80% ostial stenosis. PLA is patent with mild diffuse disease.     **The mid RCA was successfully stented using a 2.75x12 mm Promus DES.  The distal RCA was successfully stented using a 2.75x8mm Promus DES.**  Left ventriculography: there is mild inferior wall hypokinesis with an LVEF estimated at 50%. No significant MR.  _____________   History of Present Illness  57 year old male with the above problem list who was recently seen in cardiology clinic secondary to a 2 year history of progressive dyspnea exertion more recently associated with  diaphoresis and intermittent chest pain. Decision was made to pursue diagnostic catheterization.  Hospital Course  Patient presented to the [facility] cardiac catheterization laboratory on 02/08/2013. He underwent diagnostic cardiac catheterization revealing severe LAD and right coronary artery disease. He then underwent successful PCI and drug-eluting stent placement within the mid and distal right coronary artery (2 stents), in the LAD. He tolerated procedure well and post procedure has ambulated without recurrent symptoms or limitations. He will be discharged home today in good condition.  Discharge Vitals Blood pressure 142/85, pulse 80, temperature 97.5 F (36.4 C), temperature source Oral, resp. rate 18, height 5\' 11"  (1.803 m), weight 264 lb 5.3 oz (119.9 kg), SpO2 99.00%.  Filed Weights   02/08/13 1024 02/09/13 0022  Weight: 268 lb (121.564 kg) 264 lb 5.3 oz (119.9 kg)   Labs  CBC  Recent Labs  02/08/13 1008 02/09/13 0600  WBC 5.9 8.3  HGB 14.9 14.1  HCT 41.9 40.0  MCV 84.5 85.7  PLT 185 162   Basic Metabolic Panel  Recent Labs  02/08/13 1008 02/09/13 0600  NA 137 137  K 3.9 3.7  CL 99 100  CO2 26 26  GLUCOSE 218* 170*  BUN 22 26*  CREATININE 0.67 0.77  CALCIUM 9.3 8.7   Disposition  Pt is being discharged home today in good condition.  Follow-up Plans & Appointments      Follow-up Information   Follow up with Joni Reining, NP On 02/23/2013. (1:30 PM)    Specialty:  Nurse Practitioner   Contact information:   [address] [place] [address] [phone]       Follow  up with Kirk Ruths, MD. (as scheduled)    Specialty:  Family Medicine   Contact information:   [address] [place] [address] [phone]      Discharge Medications    Medication List    STOP taking these medications       naproxen 500 MG tablet  Commonly known as:  NAPROSYN      TAKE these medications       aspirin  81 MG tablet  Take 1 tablet (81 mg total) by mouth daily.     clopidogrel 75 MG tablet  Commonly known as:  PLAVIX  Take 1 tablet (75 mg total) by mouth daily with breakfast.     fenofibrate 160 MG tablet  Take 160 mg by mouth daily.     insulin glargine 100 UNIT/ML injection  Commonly known as:  LANTUS  Inject 70 Units into the skin at bedtime.     lisinopril-hydrochlorothiazide 20-12.5 MG per tablet  Commonly known as:  PRINZIDE,ZESTORETIC  Take 1 tablet by mouth daily.     metFORMIN 500 MG tablet  Commonly known as:  GLUCOPHAGE  Take 1 tablet (500 mg total) by mouth 2 (two) times daily with a meal.     nitroGLYCERIN 0.4 MG SL tablet  Commonly known as:  NITROSTAT  Place 1 tablet (0.4 mg total) under the tongue every 5 (five) minutes as needed for chest pain.     oxyCODONE 15 MG immediate release tablet  Commonly known as:  ROXICODONE  Take 15 mg by mouth every 6 (six) hours as needed. pain     pantoprazole 40 MG tablet  Commonly known as:  PROTONIX  Take 40 mg by mouth daily.     rosuvastatin 5 MG tablet  Commonly known as:  CRESTOR  Take 1 tablet (5 mg total) by mouth daily.       Outstanding Labs/Studies  F/u lipids/lft's in 8 wks.  Duration of Discharge Encounter   Greater than 30 minutes including physician time.  SignedNicolasa Ducking NP 02/09/2013, 12:00 PM

## 2013-02-15 ENCOUNTER — Encounter (HOSPITAL_COMMUNITY): Payer: Self-pay | Admitting: Emergency Medicine

## 2013-02-15 ENCOUNTER — Emergency Department (HOSPITAL_COMMUNITY)
Admission: EM | Admit: 2013-02-15 | Discharge: 2013-02-15 | Disposition: A | Discharge status: Home / Self Care | Payer: Medicare Other | Attending: Emergency Medicine | Admitting: Emergency Medicine

## 2013-02-15 DIAGNOSIS — E119 Type 2 diabetes mellitus without complications: Secondary | ICD-10-CM | POA: Insufficient documentation

## 2013-02-15 DIAGNOSIS — R1909 Other intra-abdominal and pelvic swelling, mass and lump: Secondary | ICD-10-CM | POA: Insufficient documentation

## 2013-02-15 DIAGNOSIS — IMO0002 Reserved for concepts with insufficient information to code with codable children: Secondary | ICD-10-CM

## 2013-02-15 DIAGNOSIS — Z9861 Coronary angioplasty status: Secondary | ICD-10-CM | POA: Insufficient documentation

## 2013-02-15 DIAGNOSIS — K219 Gastro-esophageal reflux disease without esophagitis: Secondary | ICD-10-CM | POA: Insufficient documentation

## 2013-02-15 DIAGNOSIS — Z87891 Personal history of nicotine dependence: Secondary | ICD-10-CM | POA: Insufficient documentation

## 2013-02-15 DIAGNOSIS — Z7982 Long term (current) use of aspirin: Secondary | ICD-10-CM | POA: Insufficient documentation

## 2013-02-15 DIAGNOSIS — Z794 Long term (current) use of insulin: Secondary | ICD-10-CM | POA: Insufficient documentation

## 2013-02-15 DIAGNOSIS — E785 Hyperlipidemia, unspecified: Secondary | ICD-10-CM | POA: Insufficient documentation

## 2013-02-15 DIAGNOSIS — M129 Arthropathy, unspecified: Secondary | ICD-10-CM | POA: Insufficient documentation

## 2013-02-15 DIAGNOSIS — Z8669 Personal history of other diseases of the nervous system and sense organs: Secondary | ICD-10-CM | POA: Insufficient documentation

## 2013-02-15 DIAGNOSIS — Z79899 Other long term (current) drug therapy: Secondary | ICD-10-CM | POA: Insufficient documentation

## 2013-02-15 DIAGNOSIS — Z7902 Long term (current) use of antithrombotics/antiplatelets: Secondary | ICD-10-CM | POA: Insufficient documentation

## 2013-02-15 DIAGNOSIS — G4733 Obstructive sleep apnea (adult) (pediatric): Secondary | ICD-10-CM | POA: Insufficient documentation

## 2013-02-15 DIAGNOSIS — Z87828 Personal history of other (healed) physical injury and trauma: Secondary | ICD-10-CM | POA: Insufficient documentation

## 2013-02-15 DIAGNOSIS — I2510 Atherosclerotic heart disease of native coronary artery without angina pectoris: Secondary | ICD-10-CM | POA: Insufficient documentation

## 2013-02-15 DIAGNOSIS — I10 Essential (primary) hypertension: Secondary | ICD-10-CM | POA: Insufficient documentation
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
## 2013-02-15 NOTE — ED Notes (Signed)
Patient states he had a cardiac cath on Thursday and in the last several days, he has noticed a knot getting bigger in his right groin.

## 2013-02-23 ENCOUNTER — Encounter: Payer: Medicare Other | Admitting: Adult Health

## 2013-02-23 ENCOUNTER — Encounter: Payer: Self-pay | Admitting: Cardiovascular Disease
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
## 2013-02-26 ENCOUNTER — Encounter: Payer: Medicare Other | Admitting: Adult Health
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
## 2013-03-05 ENCOUNTER — Encounter: Payer: Medicare Other | Admitting: Adult Health

## 2013-03-05 ENCOUNTER — Encounter: Payer: Self-pay | Admitting: *Deleted
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
## 2013-03-16 ENCOUNTER — Encounter: Payer: Self-pay | Admitting: Adult Health

## 2013-03-16 ENCOUNTER — Ambulatory Visit (INDEPENDENT_AMBULATORY_CARE_PROVIDER_SITE_OTHER): Payer: Medicare Other | Admitting: Adult Health

## 2013-03-16 VITALS — BP 133/84 | HR 90 | Ht 71.0 in | Wt 259.0 lb

## 2013-03-16 DIAGNOSIS — I2510 Atherosclerotic heart disease of native coronary artery without angina pectoris: Secondary | ICD-10-CM | POA: Insufficient documentation

## 2013-03-16 DIAGNOSIS — I10 Essential (primary) hypertension: Secondary | ICD-10-CM

## 2013-03-16 DIAGNOSIS — G4733 Obstructive sleep apnea (adult) (pediatric): Secondary | ICD-10-CM

## 2013-03-16 DIAGNOSIS — R0609 Other forms of dyspnea: Secondary | ICD-10-CM

## 2013-03-16 DIAGNOSIS — I25810 Atherosclerosis of coronary artery bypass graft(s) without angina pectoris: Secondary | ICD-10-CM

## 2013-03-16 NOTE — Progress Notes (Deleted)
Name: Carl Mccoy    DOB: 05/10/1955  Age: 57 y.o.  MR#: [id number]       PCP:  Kirk Ruths, MD      Insurance: Payor: [employer] [place] [employer]IC[employer] / Plan: BLUE MEDICARE / Product Type: *No Product type* /   CC:    Chief Complaint  Patient presents with  . Coronary Artery Disease    VS Filed Vitals:   03/16/13 1500  BP: 133/84  Pulse: 90  Height: 5\' 11"  (1.803 m)  Weight: 259 lb (117.482 kg)    Weights Current Weight  03/16/13 259 lb (117.482 kg)  02/15/13 260 lb (117.935 kg)  02/09/13 264 lb 5.3 oz (119.9 kg)    Blood Pressure  BP Readings from Last 3 Encounters:  03/16/13 133/84  02/15/13 133/75  02/09/13 142/85     Admit date:  (Not on file) Last encounter with RMR:  Visit date not found   Allergy Review of patient's allergies indicates no known allergies.  Current Outpatient Prescriptions  Medication Sig Dispense Refill  . aspirin EC 81 MG tablet Take 81 mg by mouth daily.      . clopidogrel (PLAVIX) 75 MG tablet Take 1 tablet (75 mg total) by mouth daily with breakfast.  30 tablet  6  . fenofibrate 160 MG tablet Take 160 mg by mouth daily.      . insulin glargine (LANTUS) 100 UNIT/ML injection Inject 70 Units into the skin 2 (two) times daily.       [name] lisinopril-hydrochlorothiazide (PRINZIDE,ZESTORETIC) 20-12.5 MG per tablet Take 1 tablet by mouth daily.      . metFORMIN (GLUCOPHAGE) 500 MG tablet Take 1 tablet (500 mg total) by mouth 2 (two) times daily with a meal.      . nitroGLYCERIN (NITROSTAT) 0.4 MG SL tablet Place 1 tablet (0.4 mg total) under the tongue every 5 (five) minutes as needed for chest pain.  25 tablet  3  . oxyCODONE (ROXICODONE) 15 MG immediate release tablet Take 15 mg by mouth every 6 (six) hours as needed. pain      . pantoprazole (PROTONIX) 40 MG tablet Take 40 mg by mouth daily.      . rosuvastatin (CRESTOR) 5 MG tablet Take 1 tablet (5 mg total) by mouth daily.  30 tablet  6   No current facility-administered  medications for this visit.    Discontinued Meds:   There are no discontinued medications.  Patient Active Problem List   Diagnosis Date Noted  . Angina, class III 02/08/2013  . DOE (dyspnea on exertion) 02/02/2013  . Mixed hyperlipidemia 02/02/2013  . Obstructive sleep apnea 02/02/2013  . Essential hypertension, benign 02/01/2013  . Type 2 diabetes mellitus 02/01/2013    LABS    Component Value Date/Time   NA 137 02/09/2013 0600   NA 137 02/08/2013 1008   NA 137 06/29/2011 0508   K 3.7 02/09/2013 0600   K 3.9 02/08/2013 1008   K 3.4* 06/29/2011 0508   CL 100 02/09/2013 0600   CL 99 02/08/2013 1008   CL 101 06/29/2011 0508   CO2 26 02/09/2013 0600   CO2 26 02/08/2013 1008   CO2 30 06/29/2011 0508   GLUCOSE 170* 02/09/2013 0600   GLUCOSE 218* 02/08/2013 1008   GLUCOSE 222* 06/29/2011 0508   BUN 26* 02/09/2013 0600   BUN 22 02/08/2013 1008   BUN 22 06/29/2011 0508   CREATININE 0.77 02/09/2013 0600   CREATININE 0.67 02/08/2013 1008  CREATININE 0.85 06/29/2011 0508   CALCIUM 8.7 02/09/2013 0600   CALCIUM 9.3 02/08/2013 1008   CALCIUM 9.1 06/29/2011 0508   GFRNONAA >90 02/09/2013 0600   GFRNONAA >90 02/08/2013 1008   GFRNONAA >90 06/29/2011 0508   GFRAA >90 02/09/2013 0600   GFRAA >90 02/08/2013 1008   GFRAA >90 06/29/2011 0508   CMP     Component Value Date/Time   NA 137 02/09/2013 0600   K 3.7 02/09/2013 0600   CL 100 02/09/2013 0600   CO2 26 02/09/2013 0600   GLUCOSE 170* 02/09/2013 0600   BUN 26* 02/09/2013 0600   CREATININE 0.77 02/09/2013 0600   CALCIUM 8.7 02/09/2013 0600   PROT 6.7 06/29/2011 0508   ALBUMIN 3.7 06/29/2011 0508   AST 16 06/29/2011 0508   ALT 17 06/29/2011 0508   ALKPHOS 101 06/29/2011 0508   BILITOT 0.2* 06/29/2011 0508   GFRNONAA >90 02/09/2013 0600   GFRAA >90 02/09/2013 0600       Component Value Date/Time   WBC 8.3 02/09/2013 0600   WBC 5.9 02/08/2013 1008   WBC 5.2 06/29/2011 0508   HGB 14.1 02/09/2013 0600   HGB 14.9 02/08/2013 1008   HGB  15.1 06/29/2011 0508   HCT 40.0 02/09/2013 0600   HCT 41.9 02/08/2013 1008   HCT 44.5 06/29/2011 0508   MCV 85.7 02/09/2013 0600   MCV 84.5 02/08/2013 1008   MCV 87.6 06/29/2011 0508    Lipid Panel  No results found for this basename: chol, trig, hdl, cholhdl, vldl, ldlcalc    ABG No results found for this basename: phart, pco2, pco2art, po2, po2art, hco3, tco2, acidbasedef, o2sat     No results found for this basename: TSH   BNP (last 3 results) No results found for this basename: PROBNP,  in the last 8760 hours Cardiac Panel (last 3 results) No results found for this basename: CKTOTAL, CKMB, TROPONINI, RELINDX,  in the last 72 hours  Iron/TIBC/Ferritin No results found for this basename: iron, tibc, ferritin     EKG Orders placed during the hospital encounter of 02/08/13  . EKG 12-LEAD  . EKG 12-LEAD  . EKG 12-LEAD  . EKG 12-LEAD  . EKG 12-LEAD  . EKG 12-LEAD  . EKG     Prior Assessment and Plan Problem List as of 03/16/2013     Cardiovascular and Mediastinum   Essential hypertension, benign   Last Assessment & Plan   02/02/2013 Office Visit Written 02/02/2013  3:10 PM by Jonelle Sidle, MD     Poorly controlled today, complicated by obesity and OSA. He reports compliance with medications. Will likely need further up titration.    Angina, class III     Respiratory   Obstructive sleep apnea   Last Assessment & Plan   02/02/2013 Office Visit Written 02/02/2013  3:11 PM by Jonelle Sidle, MD     He reports compliance with CPAP.      Endocrine   Type 2 diabetes mellitus   Last Assessment & Plan   02/02/2013 Office Visit Written 02/02/2013  3:11 PM by Jonelle Sidle, MD     On oral therapy, followed by Dr. Regino Schultze.      Other   DOE (dyspnea on exertion)   Last Assessment & Plan   02/02/2013 Office Visit Written 02/02/2013  3:10 PM by Jonelle Sidle, MD     Progressive as outlined above and also associated with intermittent atypical chest pain. ECG is  nonspecific. He has a  substantially increased cardiac risk factor profile including impressive family history of premature CAD, type 2 diabetes mellitus, uncontrolled hypertension, and hyperlipidemia. He reports a reassuring stress test a little over a year ago with [facility], records not available. Symptoms have progressed since that time. We discussed the risk and benefits of diagnostic cardiac catheterization and this will be arranged in the near future.    Mixed hyperlipidemia   Last Assessment & Plan   02/02/2013 Office Visit Written 02/02/2013  3:11 PM by Jonelle Sidle, MD     On fenofibrate, high triglycerides noted most recently.        Imaging: No results found.

## 2013-03-16 NOTE — Assessment & Plan Note (Signed)
Excellent control of BP at this time. He will continue current medications. We will see him in 6 months.

## 2013-03-16 NOTE — Patient Instructions (Signed)
Your physician wants you to follow-up in: 6 months with [facility].  You will receive a reminder letter in the mail two months in advance. If you don't receive a letter, please call our office to schedule the follow-up appointment.  Your physician recommends that you continue on your current medications as directed. Please refer to the Current Medication list given to you today.

## 2013-03-16 NOTE — Assessment & Plan Note (Signed)
He is continuing to have DOE which I believe is related to his weight. He is not smoking. He is to be seen in cardiac rehab and will be reinforced in exercise regimen,

## 2013-03-16 NOTE — Assessment & Plan Note (Signed)
Continues use of CPAP at HS.
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
## 2013-03-27 ENCOUNTER — Encounter (HOSPITAL_COMMUNITY): Payer: Medicare Other

## 2013-06-27 ENCOUNTER — Ambulatory Visit: Payer: Medicare Other | Admitting: Physician Assistant

## 2013-07-03 ENCOUNTER — Encounter: Payer: Self-pay | Admitting: Adult Health

## 2013-07-03 ENCOUNTER — Ambulatory Visit (INDEPENDENT_AMBULATORY_CARE_PROVIDER_SITE_OTHER): Payer: Medicare Other | Admitting: Adult Health

## 2013-07-03 VITALS — BP 132/60 | HR 83 | Ht 71.0 in | Wt 261.8 lb

## 2013-07-03 DIAGNOSIS — I25810 Atherosclerosis of coronary artery bypass graft(s) without angina pectoris: Secondary | ICD-10-CM

## 2013-07-03 DIAGNOSIS — I10 Essential (primary) hypertension: Secondary | ICD-10-CM

## 2013-07-03 DIAGNOSIS — R0989 Other specified symptoms and signs involving the circulatory and respiratory systems: Secondary | ICD-10-CM

## 2013-07-03 DIAGNOSIS — R0609 Other forms of dyspnea: Secondary | ICD-10-CM

## 2013-07-03 MED ORDER — LISINOPRIL-HYDROCHLOROTHIAZIDE 20-25 MG PO TABS: 1.0000 | ORAL_TABLET | Freq: Every day | ORAL | Status: DC

## 2013-07-03 NOTE — Progress Notes (Deleted)
Name: Carl Mccoy    DOB: May 18, 1955  Age: 58 y.o.  MR#: [id number]       PCP:  Rosita Fire, MD      Insurance: Payor: [place] / Plan: BLUE MEDICARE / Product Type: *No Product type* /   CC:    Chief Complaint  Patient presents with  . Hypertension  . Coronary Artery Disease    VS Filed Vitals:   07/03/13 1436  BP: 132/60  Pulse: 83  Height: 5\' 11"  (1.803 m)  Weight: 261 lb 12.8 oz (118.752 kg)  SpO2: 97%    Weights Current Weight  07/03/13 261 lb 12.8 oz (118.752 kg)  03/16/13 259 lb (117.482 kg)  02/15/13 260 lb (117.935 kg)    Blood Pressure  BP Readings from Last 3 Encounters:  07/03/13 132/60  03/16/13 133/84  02/15/13 133/75     Admit date:  (Not on file) Last encounter with RMR:  03/16/2013   Allergy Review of patient's allergies indicates no known allergies.  Current Outpatient Prescriptions  Medication Sig Dispense Refill  . aspirin EC 81 MG tablet Take 81 mg by mouth daily.      . clopidogrel (PLAVIX) 75 MG tablet Take 1 tablet (75 mg total) by mouth daily with breakfast.  30 tablet  6  . fenofibrate 160 MG tablet Take 160 mg by mouth daily.      . insulin glargine (LANTUS) 100 UNIT/ML injection Inject 70 Units into the skin 2 (two) times daily.       [name] lisinopril-hydrochlorothiazide (PRINZIDE,ZESTORETIC) 20-12.5 MG per tablet Take 1 tablet by mouth daily.      . metFORMIN (GLUCOPHAGE) 500 MG tablet Take 1 tablet (500 mg total) by mouth 2 (two) times daily with a meal.      . nitroGLYCERIN (NITROSTAT) 0.4 MG SL tablet Place 1 tablet (0.4 mg total) under the tongue every 5 (five) minutes as needed for chest pain.  25 tablet  3  . oxyCODONE (ROXICODONE) 15 MG immediate release tablet Take 15 mg by mouth every 6 (six) hours as needed. pain      . pantoprazole (PROTONIX) 40 MG tablet Take 40 mg by mouth daily.      . rosuvastatin (CRESTOR) 5 MG tablet Take 1 tablet (5 mg total) by mouth daily.  30 tablet  6   No current  facility-administered medications for this visit.    Discontinued Meds:   There are no discontinued medications.  Patient Active Problem List   Diagnosis Date Noted  . CAD (coronary artery disease) of artery bypass graft 03/16/2013  . DOE (dyspnea on exertion) 02/02/2013  . Mixed hyperlipidemia 02/02/2013  . Obstructive sleep apnea 02/02/2013  . Essential hypertension, benign 02/01/2013  . Type 2 diabetes mellitus 02/01/2013    LABS    Component Value Date/Time   NA 137 02/09/2013 0600   NA 137 02/08/2013 1008   NA 137 06/29/2011 0508   K 3.7 02/09/2013 0600   K 3.9 02/08/2013 1008   K 3.4* 06/29/2011 0508   CL 100 02/09/2013 0600   CL 99 02/08/2013 1008   CL 101 06/29/2011 0508   CO2 26 02/09/2013 0600   CO2 26 02/08/2013 1008   CO2 30 06/29/2011 0508   GLUCOSE 170* 02/09/2013 0600   GLUCOSE 218* 02/08/2013 1008   GLUCOSE 222* 06/29/2011 0508   BUN 26* 02/09/2013 0600   BUN 22 02/08/2013 1008   BUN 22 06/29/2011 0508   CREATININE 0.77  02/09/2013 0600   CREATININE 0.67 02/08/2013 1008   CREATININE 0.85 06/29/2011 0508   CALCIUM 8.7 02/09/2013 0600   CALCIUM 9.3 02/08/2013 1008   CALCIUM 9.1 06/29/2011 0508   GFRNONAA >90 02/09/2013 0600   GFRNONAA >90 02/08/2013 1008   GFRNONAA >90 06/29/2011 0508   GFRAA >90 02/09/2013 0600   GFRAA >90 02/08/2013 1008   GFRAA >90 06/29/2011 0508   CMP     Component Value Date/Time   NA 137 02/09/2013 0600   K 3.7 02/09/2013 0600   CL 100 02/09/2013 0600   CO2 26 02/09/2013 0600   GLUCOSE 170* 02/09/2013 0600   BUN 26* 02/09/2013 0600   CREATININE 0.77 02/09/2013 0600   CALCIUM 8.7 02/09/2013 0600   PROT 6.7 06/29/2011 0508   ALBUMIN 3.7 06/29/2011 0508   AST 16 06/29/2011 0508   ALT 17 06/29/2011 0508   ALKPHOS 101 06/29/2011 0508   BILITOT 0.2* 06/29/2011 0508   GFRNONAA >90 02/09/2013 0600   GFRAA >90 02/09/2013 0600       Component Value Date/Time   WBC 8.3 02/09/2013 0600   WBC 5.9 02/08/2013 1008   WBC 5.2 06/29/2011 0508   HGB  14.1 02/09/2013 0600   HGB 14.9 02/08/2013 1008   HGB 15.1 06/29/2011 0508   HCT 40.0 02/09/2013 0600   HCT 41.9 02/08/2013 1008   HCT 44.5 06/29/2011 0508   MCV 85.7 02/09/2013 0600   MCV 84.5 02/08/2013 1008   MCV 87.6 06/29/2011 0508    Lipid Panel  No results found for this basename: chol, trig, hdl, cholhdl, vldl, ldlcalc    ABG No results found for this basename: phart, pco2, pco2art, po2, po2art, hco3, tco2, acidbasedef, o2sat     No results found for this basename: TSH   BNP (last 3 results) No results found for this basename: PROBNP,  in the last 8760 hours Cardiac Panel (last 3 results) No results found for this basename: CKTOTAL, CKMB, TROPONINI, RELINDX,  in the last 72 hours  Iron/TIBC/Ferritin No results found for this basename: iron, tibc, ferritin     EKG Orders placed during the hospital encounter of 02/08/13  . EKG 12-LEAD  . EKG 12-LEAD  . EKG 12-LEAD  . EKG 12-LEAD  . EKG 12-LEAD  . EKG 12-LEAD  . EKG     Prior Assessment and Plan Problem List as of 07/03/2013     Cardiovascular and Mediastinum   Essential hypertension, benign   Last Assessment & Plan   03/16/2013 Office Visit Written 03/16/2013  3:35 PM by Lendon Colonel, NP     Excellent control of BP at this time. He will continue current medications. We will see him in 6 months.    CAD (coronary artery disease) of artery bypass graft     Respiratory   Obstructive sleep apnea   Last Assessment & Plan   03/16/2013 Office Visit Written 03/16/2013  3:35 PM by Lendon Colonel, NP     Continues use of CPAP at HS.       Endocrine   Type 2 diabetes mellitus   Last Assessment & Plan   02/02/2013 Office Visit Written 02/02/2013  3:11 PM by Satira Sark, MD     On oral therapy, followed by Dr. Orson Ape.      Other   DOE (dyspnea on exertion)   Last Assessment & Plan   03/16/2013 Office Visit Written 03/16/2013  3:34 PM by Lendon Colonel, NP     He is  continuing to have DOE which I  believe is related to his weight. He is not smoking. He is to be seen in cardiac rehab and will be reinforced in exercise regimen,    Mixed hyperlipidemia   Last Assessment & Plan   02/02/2013 Office Visit Written 02/02/2013  3:11 PM by Satira Sark, MD     On fenofibrate, high triglycerides noted most recently.        Imaging: No results found.

## 2013-07-03 NOTE — Patient Instructions (Addendum)
Your physician recommends that you schedule a follow-up appointment in: 1 month with Dr.McDowell   Your physician has recommended you make the following change in your medication:   INCREASE lisinopril hctz to 20-25 mg daily    Your physician has requested that you have a lexiscan myoview. For further information please visit [email or web address]. Please follow instruction sheet, as given.   Thank you for choosing [facility] !

## 2013-07-03 NOTE — Assessment & Plan Note (Signed)
Although this is a chronic problem for him, he states it is worse. I believe a good portion of this is related to his obesity. He is compliant with his CPAP. He states that he wakes up often anyway. He states he has no trouble breathing at night but during the day is when it is most prominent. Consideration for PFTs may be warned that if he rules out for cardiac etiology of worsening dyspnea. He needs to lose a considerable amount of weight.
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
## 2013-07-03 NOTE — Assessment & Plan Note (Signed)
Blood pressure is labile usually associated with his irritability and frustrations. He does have some mild abdominal distention. I will increase his antihypertensive medication from lisinopril HCTZ 2012.5-20/25 mg daily. He will followup with Dr. Domenic Polite at his request.

## 2013-07-03 NOTE — Assessment & Plan Note (Signed)
His main complaint is dyspnea I usually associated with eating or after a large meal. He is unable to lose weight. He also states that his breathing status with minimal exertion is worsening. With known history of CAD with stents to the RCA and LAD will plan a Lexiscan Myoview to evaluate for in-stent restenosis or progression of CAD. The patient is anxious to have this completed sitting. I have explained to him that we will be done in the morning he wants it to be at the first available appointment. Requests followup with Dr. Domenic Polite only.

## 2013-07-11 ENCOUNTER — Encounter (HOSPITAL_COMMUNITY)
Admission: RE | Admit: 2013-07-11 | Discharge: 2013-07-11 | Disposition: A | Discharge status: Home / Self Care | Payer: Medicare Other | Source: Ambulatory Visit | Attending: Adult Health | Admitting: Adult Health

## 2013-07-11 ENCOUNTER — Encounter (HOSPITAL_COMMUNITY): Payer: Self-pay

## 2013-07-11 DIAGNOSIS — R0609 Other forms of dyspnea: Secondary | ICD-10-CM

## 2013-07-11 DIAGNOSIS — I25810 Atherosclerosis of coronary artery bypass graft(s) without angina pectoris: Secondary | ICD-10-CM

## 2013-07-11 DIAGNOSIS — R0989 Other specified symptoms and signs involving the circulatory and respiratory systems: Secondary | ICD-10-CM

## 2013-07-11 MED ORDER — REGADENOSON 0.4 MG/5ML IV SOLN
INTRAVENOUS | Status: AC
  Administered 2013-07-11: 0.4 mg via INTRAVENOUS
  Filled 2013-07-11: qty 5

## 2013-07-11 MED ORDER — TECHNETIUM TC 99M SESTAMIBI - CARDIOLITE
10.0000 | Freq: Once | INTRAVENOUS | Status: AC | PRN
  Administered 2013-07-11: 08:00:00 10 via INTRAVENOUS

## 2013-07-11 MED ORDER — SODIUM CHLORIDE 0.9 % IJ SOLN
INTRAMUSCULAR | Status: AC
  Administered 2013-07-11: 10 mL via INTRAVENOUS
  Filled 2013-07-11: qty 10

## 2013-07-11 MED ORDER — TECHNETIUM TC 99M SESTAMIBI GENERIC - CARDIOLITE
30.0000 | Freq: Once | INTRAVENOUS | Status: AC | PRN
  Administered 2013-07-11: 30 via INTRAVENOUS

## 2013-07-11 NOTE — Progress Notes (Signed)
Stress Lab Nurses Notes - Forestine Na  LUKEN SHADOWENS 07/11/2013 Reason for doing test: CAD and Dyspnea Type of test: Wille Glaser Nurse performing test: Gerrit Halls, RN Nuclear Medicine Tech: Melburn Hake Echo Tech: Not Applicable MD performing test: Branch/M.Lenze PA Family MD: McGough Test explained and consent signed: yes IV started: 22g jelco, Saline lock flushed, No redness or edema and Saline lock started in radiology Symptoms: SOB Treatment/Intervention: None Reason test stopped: protocol completed After recovery IV was: Discontinued via X-ray tech and No redness or edema Patient to return to [facility]. Med at : 10:15 Patient discharged: Home Patient's Condition upon discharge was: stable Comments: During test BP 125/70 & HR 104.  Recovery BP 134/74 & HR 88.  Symptoms resolved in recovery. Geanie Cooley T

## 2013-07-12 ENCOUNTER — Telehealth: Payer: Self-pay

## 2013-07-12 NOTE — Telephone Encounter (Signed)
Scheduled fu with Dr.McDowell 07/16/13 1 pm to discuss abnormal myoview

## 2013-07-16 ENCOUNTER — Other Ambulatory Visit: Payer: Self-pay | Admitting: Cardiology

## 2013-07-16 ENCOUNTER — Ambulatory Visit (INDEPENDENT_AMBULATORY_CARE_PROVIDER_SITE_OTHER): Payer: Medicare Other | Admitting: Cardiology

## 2013-07-16 ENCOUNTER — Encounter: Payer: Self-pay | Admitting: Cardiology

## 2013-07-16 ENCOUNTER — Encounter (HOSPITAL_COMMUNITY): Payer: Self-pay | Admitting: Pharmacy Technician

## 2013-07-16 VITALS — BP 159/74 | HR 78 | Ht 71.0 in | Wt 265.0 lb

## 2013-07-16 DIAGNOSIS — R9439 Abnormal result of other cardiovascular function study: Secondary | ICD-10-CM

## 2013-07-16 DIAGNOSIS — R0989 Other specified symptoms and signs involving the circulatory and respiratory systems: Secondary | ICD-10-CM

## 2013-07-16 DIAGNOSIS — E119 Type 2 diabetes mellitus without complications: Secondary | ICD-10-CM

## 2013-07-16 DIAGNOSIS — R0609 Other forms of dyspnea: Secondary | ICD-10-CM

## 2013-07-16 DIAGNOSIS — I10 Essential (primary) hypertension: Secondary | ICD-10-CM

## 2013-07-16 DIAGNOSIS — I25810 Atherosclerosis of coronary artery bypass graft(s) without angina pectoris: Secondary | ICD-10-CM

## 2013-07-16 DIAGNOSIS — E782 Mixed hyperlipidemia: Secondary | ICD-10-CM

## 2013-07-16 NOTE — Patient Instructions (Addendum)
Your physician recommends that you schedule a follow-up appointment to be determined after heart cath     Your physician recommends that you continue on your current medications as directed. Please refer to the Current Medication list given to you today.    Your physician has requested that you have a cardiac catheterization. Cardiac catheterization is used to diagnose and/or treat various heart conditions. Doctors may recommend this procedure for a number of different reasons. The most common reason is to evaluate chest pain. Chest pain can be a symptom of coronary artery disease (CAD), and cardiac catheterization can show whether plaque is narrowing or blocking your heart's arteries. This procedure is also used to evaluate the valves, as well as measure the blood flow and oxygen levels in different parts of your heart. For further information please visit [email or web address]. Please follow instruction sheet, as given.    Thank you for choosing [facility] !

## 2013-07-16 NOTE — Assessment & Plan Note (Signed)
Keep followup with Dr. Fanta. 

## 2013-07-16 NOTE — Assessment & Plan Note (Signed)
Blood pressure elevated today. He reports compliance with medications. Also needs to work on sodium restriction and weight loss.

## 2013-07-16 NOTE — Assessment & Plan Note (Signed)
Continues on Crestor.

## 2013-07-16 NOTE — Assessment & Plan Note (Signed)
Status post DES to the proximal LAD and DES x2 to the distal RCA back in October 2014.

## 2013-07-16 NOTE — Assessment & Plan Note (Signed)
Progressive as outlined above. He states that he initially felt better after his percutaneous interventions in October 2014. Recent followup Cardiolite shows inferior scar with moderate peri-infarct ischemia. He reports compliance with his medications. Although some of his shortness of breath may be multifactorial with excess weight in particular, concern would be that he has had restenosis or a de novo lesion in the RCA. We discussed risks and benefits of diagnostic cardiac catheterization to reassess coronary anatomy and determine if any revascularization options need to be considered. He is in agreement to proceed. Procedure scheduled for Friday with Dr. Irish Lack.
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
## 2013-07-17 ENCOUNTER — Emergency Department (HOSPITAL_COMMUNITY): Payer: Medicare Other

## 2013-07-17 ENCOUNTER — Encounter (HOSPITAL_COMMUNITY): Payer: Self-pay | Admitting: Emergency Medicine

## 2013-07-17 ENCOUNTER — Emergency Department (HOSPITAL_COMMUNITY)
Admission: EM | Admit: 2013-07-17 | Discharge: 2013-07-17 | Disposition: A | Discharge status: Home / Self Care | Payer: Medicare Other | Attending: Emergency Medicine | Admitting: Emergency Medicine

## 2013-07-17 DIAGNOSIS — K760 Fatty (change of) liver, not elsewhere classified: Secondary | ICD-10-CM

## 2013-07-17 DIAGNOSIS — Z7902 Long term (current) use of antithrombotics/antiplatelets: Secondary | ICD-10-CM | POA: Insufficient documentation

## 2013-07-17 DIAGNOSIS — Z794 Long term (current) use of insulin: Secondary | ICD-10-CM | POA: Insufficient documentation

## 2013-07-17 DIAGNOSIS — I2510 Atherosclerotic heart disease of native coronary artery without angina pectoris: Secondary | ICD-10-CM | POA: Insufficient documentation

## 2013-07-17 DIAGNOSIS — Z9861 Coronary angioplasty status: Secondary | ICD-10-CM | POA: Insufficient documentation

## 2013-07-17 DIAGNOSIS — Z79899 Other long term (current) drug therapy: Secondary | ICD-10-CM | POA: Insufficient documentation

## 2013-07-17 DIAGNOSIS — E7889 Other lipoprotein metabolism disorders: Secondary | ICD-10-CM | POA: Insufficient documentation

## 2013-07-17 DIAGNOSIS — I10 Essential (primary) hypertension: Secondary | ICD-10-CM | POA: Insufficient documentation

## 2013-07-17 DIAGNOSIS — R112 Nausea with vomiting, unspecified: Secondary | ICD-10-CM | POA: Insufficient documentation

## 2013-07-17 DIAGNOSIS — Z87891 Personal history of nicotine dependence: Secondary | ICD-10-CM | POA: Insufficient documentation

## 2013-07-17 DIAGNOSIS — M129 Arthropathy, unspecified: Secondary | ICD-10-CM | POA: Insufficient documentation

## 2013-07-17 DIAGNOSIS — Z7982 Long term (current) use of aspirin: Secondary | ICD-10-CM | POA: Insufficient documentation

## 2013-07-17 DIAGNOSIS — E119 Type 2 diabetes mellitus without complications: Secondary | ICD-10-CM | POA: Insufficient documentation

## 2013-07-17 DIAGNOSIS — K219 Gastro-esophageal reflux disease without esophagitis: Secondary | ICD-10-CM | POA: Insufficient documentation

## 2013-07-17 DIAGNOSIS — R109 Unspecified abdominal pain: Secondary | ICD-10-CM

## 2013-07-17 DIAGNOSIS — R61 Generalized hyperhidrosis: Secondary | ICD-10-CM | POA: Insufficient documentation

## 2013-07-17 DIAGNOSIS — E785 Hyperlipidemia, unspecified: Secondary | ICD-10-CM | POA: Insufficient documentation

## 2013-07-17 DIAGNOSIS — G4733 Obstructive sleep apnea (adult) (pediatric): Secondary | ICD-10-CM | POA: Insufficient documentation

## 2013-07-17 LAB — COMPREHENSIVE METABOLIC PANEL
ALT: 22 U/L (ref 0–53)
AST: 22 U/L (ref 0–37)
Albumin: 3.9 g/dL (ref 3.5–5.2)
Alkaline Phosphatase: 64 U/L (ref 39–117)
BUN: 32 mg/dL — ABNORMAL HIGH (ref 6–23)
CALCIUM: 9.2 mg/dL (ref 8.4–10.5)
CO2: 28 mEq/L (ref 19–32)
CREATININE: 1.05 mg/dL (ref 0.50–1.35)
Chloride: 101 mEq/L (ref 96–112)
GFR calc non Af Amer: 76 mL/min — ABNORMAL LOW (ref >90)
GFR, EST AFRICAN AMERICAN: 89 mL/min — AB (ref >90)
GLUCOSE: 212 mg/dL — AB (ref 70–99)
Potassium: 3.5 mEq/L — ABNORMAL LOW (ref 3.7–5.3)
Sodium: 142 mEq/L (ref 137–147)
TOTAL PROTEIN: 6.8 g/dL (ref 6.0–8.3)
Total Bilirubin: 0.4 mg/dL (ref 0.3–1.2)

## 2013-07-17 LAB — URINALYSIS, ROUTINE W REFLEX MICROSCOPIC
BILIRUBIN URINE: NEGATIVE
Glucose, UA: NEGATIVE mg/dL
Hgb urine dipstick: NEGATIVE
Ketones, ur: NEGATIVE mg/dL
Leukocytes, UA: NEGATIVE
NITRITE: NEGATIVE
Protein, ur: NEGATIVE mg/dL
Specific Gravity, Urine: >1.03 — ABNORMAL HIGH (ref 1.005–1.030)
UROBILINOGEN UA: 0.2 mg/dL (ref 0.0–1.0)
pH: 5 (ref 5.0–8.0)

## 2013-07-17 LAB — CBC WITH DIFFERENTIAL/PLATELET
BASOS PCT: 0 % (ref 0–1)
Basophils Absolute: 0 10*3/uL (ref 0.0–0.1)
Eosinophils Absolute: 0.1 10*3/uL (ref 0.0–0.7)
Eosinophils Relative: 2 % (ref 0–5)
HCT: 38.9 % — ABNORMAL LOW (ref 39.0–52.0)
HEMOGLOBIN: 13.1 g/dL (ref 13.0–17.0)
LYMPHS ABS: 1.3 10*3/uL (ref 0.7–4.0)
LYMPHS PCT: 18 % (ref 12–46)
MCH: 29.3 pg (ref 26.0–34.0)
MCHC: 33.7 g/dL (ref 30.0–36.0)
MCV: 87 fL (ref 78.0–100.0)
MONOS PCT: 7 % (ref 3–12)
Monocytes Absolute: 0.5 10*3/uL (ref 0.1–1.0)
NEUTROS ABS: 5.6 10*3/uL (ref 1.7–7.7)
NEUTROS PCT: 74 % (ref 43–77)
Platelets: 178 10*3/uL (ref 150–400)
RBC: 4.47 MIL/uL (ref 4.22–5.81)
RDW: 13.2 % (ref 11.5–15.5)
WBC: 7.6 10*3/uL (ref 4.0–10.5)

## 2013-07-17 LAB — I-STAT TROPONIN, ED: TROPONIN I, POC: 0 ng/mL (ref 0.00–0.08)

## 2013-07-17 LAB — LIPASE, BLOOD: Lipase: 20 U/L (ref 11–59)
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
## 2013-07-17 NOTE — ED Notes (Signed)
Pt c/o RUQ pain that began this morning. Pt reports one episode of vomiting this morning as well. Pt denies diarrhea. Pain does not radiate.

## 2013-07-17 NOTE — ED Notes (Signed)
Pt c/o right upper quad abd pain that started suddenly this am with one episode of n/v, pt arrived to er diaphoretic, denies any sob,

## 2013-07-17 NOTE — Discharge Instructions (Signed)

## 2013-07-20 ENCOUNTER — Other Ambulatory Visit: Payer: Self-pay

## 2013-07-20 ENCOUNTER — Encounter (HOSPITAL_COMMUNITY): Payer: Self-pay | Admitting: General Practice

## 2013-07-20 ENCOUNTER — Encounter (HOSPITAL_COMMUNITY)
Admission: RE | Disposition: A | Discharge status: Home / Self Care | Payer: Medicare Other | Source: Ambulatory Visit | Attending: Interventional Cardiology

## 2013-07-20 ENCOUNTER — Ambulatory Visit (HOSPITAL_COMMUNITY)
Admission: RE | Admit: 2013-07-20 | Discharge: 2013-07-21 | Disposition: A | Discharge status: Home / Self Care | Payer: Medicare Other | Source: Ambulatory Visit | Attending: Interventional Cardiology | Admitting: Interventional Cardiology

## 2013-07-20 DIAGNOSIS — Z794 Long term (current) use of insulin: Secondary | ICD-10-CM | POA: Insufficient documentation

## 2013-07-20 DIAGNOSIS — I25810 Atherosclerosis of coronary artery bypass graft(s) without angina pectoris: Secondary | ICD-10-CM

## 2013-07-20 DIAGNOSIS — Y831 Surgical operation with implant of artificial internal device as the cause of abnormal reaction of the patient, or of later complication, without mention of misadventure at the time of the procedure: Secondary | ICD-10-CM | POA: Insufficient documentation

## 2013-07-20 DIAGNOSIS — G4733 Obstructive sleep apnea (adult) (pediatric): Secondary | ICD-10-CM | POA: Insufficient documentation

## 2013-07-20 DIAGNOSIS — R9430 Abnormal result of cardiovascular function study, unspecified: Secondary | ICD-10-CM | POA: Diagnosis present

## 2013-07-20 DIAGNOSIS — I2510 Atherosclerotic heart disease of native coronary artery without angina pectoris: Secondary | ICD-10-CM

## 2013-07-20 DIAGNOSIS — R9439 Abnormal result of other cardiovascular function study: Secondary | ICD-10-CM

## 2013-07-20 DIAGNOSIS — T82897A Other specified complication of cardiac prosthetic devices, implants and grafts, initial encounter: Secondary | ICD-10-CM | POA: Insufficient documentation

## 2013-07-20 DIAGNOSIS — S88119A Complete traumatic amputation at level between knee and ankle, unspecified lower leg, initial encounter: Secondary | ICD-10-CM | POA: Insufficient documentation

## 2013-07-20 DIAGNOSIS — E119 Type 2 diabetes mellitus without complications: Secondary | ICD-10-CM | POA: Insufficient documentation

## 2013-07-20 DIAGNOSIS — F430 Acute stress reaction: Secondary | ICD-10-CM | POA: Insufficient documentation

## 2013-07-20 DIAGNOSIS — Z7902 Long term (current) use of antithrombotics/antiplatelets: Secondary | ICD-10-CM | POA: Insufficient documentation

## 2013-07-20 DIAGNOSIS — Z9861 Coronary angioplasty status: Secondary | ICD-10-CM | POA: Insufficient documentation

## 2013-07-20 DIAGNOSIS — Z23 Encounter for immunization: Secondary | ICD-10-CM | POA: Insufficient documentation

## 2013-07-20 DIAGNOSIS — I701 Atherosclerosis of renal artery: Secondary | ICD-10-CM

## 2013-07-20 DIAGNOSIS — E782 Mixed hyperlipidemia: Secondary | ICD-10-CM | POA: Insufficient documentation

## 2013-07-20 DIAGNOSIS — I10 Essential (primary) hypertension: Secondary | ICD-10-CM

## 2013-07-20 DIAGNOSIS — Z7982 Long term (current) use of aspirin: Secondary | ICD-10-CM | POA: Insufficient documentation

## 2013-07-20 DIAGNOSIS — M129 Arthropathy, unspecified: Secondary | ICD-10-CM | POA: Insufficient documentation

## 2013-07-20 DIAGNOSIS — I209 Angina pectoris, unspecified: Secondary | ICD-10-CM | POA: Insufficient documentation

## 2013-07-20 DIAGNOSIS — R0609 Other forms of dyspnea: Secondary | ICD-10-CM

## 2013-07-20 HISTORY — PX: LEFT HEART CATHETERIZATION WITH CORONARY ANGIOGRAM: SHX5451

## 2013-07-20 LAB — PROTIME-INR
INR: 1.02 (ref 0.00–1.49)
Prothrombin Time: 13.2 seconds (ref 11.6–15.2)

## 2013-07-20 LAB — GLUCOSE, CAPILLARY
GLUCOSE-CAPILLARY: 88 mg/dL (ref 70–99)
GLUCOSE-CAPILLARY: 151 mg/dL — AB (ref 70–99)
Glucose-Capillary: 242 mg/dL — ABNORMAL HIGH (ref 70–99)

## 2013-07-20 LAB — POCT ACTIVATED CLOTTING TIME: Activated Clotting Time: 382 seconds

## 2013-07-20 SURGERY — LEFT HEART CATHETERIZATION WITH CORONARY ANGIOGRAM
Anesthesia: LOCAL

## 2013-07-20 MED ORDER — CLOPIDOGREL BISULFATE 75 MG PO TABS
75.0000 mg | ORAL_TABLET | Freq: Every day | ORAL | Status: DC
Start: 2013-07-21 — End: 2013-07-21
  Administered 2013-07-21: 08:00:00 75 mg via ORAL
  Filled 2013-07-20: qty 1

## 2013-07-20 MED ORDER — MIDAZOLAM HCL 2 MG/2ML IJ SOLN
INTRAMUSCULAR | Status: AC
  Filled 2013-07-20: qty 2

## 2013-07-20 MED ORDER — FENTANYL CITRATE 0.05 MG/ML IJ SOLN
INTRAMUSCULAR | Status: AC
  Filled 2013-07-20: qty 2

## 2013-07-20 MED ORDER — NITROGLYCERIN 0.4 MG SL SUBL: 0.4000 mg | SUBLINGUAL_TABLET | SUBLINGUAL | Status: DC | PRN

## 2013-07-20 MED ORDER — ATORVASTATIN CALCIUM 10 MG PO TABS
10.0000 mg | ORAL_TABLET | Freq: Every day | ORAL | Status: DC
  Administered 2013-07-20: 10 mg via ORAL
  Filled 2013-07-20 (×2): qty 1

## 2013-07-20 MED ORDER — CLOPIDOGREL BISULFATE 75 MG PO TABS: 75.0000 mg | ORAL_TABLET | Freq: Every day | ORAL | Status: DC

## 2013-07-20 MED ORDER — PANTOPRAZOLE SODIUM 40 MG PO TBEC
40.0000 mg | DELAYED_RELEASE_TABLET | Freq: Every day | ORAL | Status: DC
  Administered 2013-07-20 – 2013-07-21 (×2): 40 mg via ORAL
  Filled 2013-07-20 (×2): qty 1

## 2013-07-20 MED ORDER — INSULIN GLARGINE 100 UNIT/ML ~~LOC~~ SOLN
70.0000 [IU] | Freq: Two times a day (BID) | SUBCUTANEOUS | Status: DC
  Administered 2013-07-20 – 2013-07-21 (×2): 70 [IU] via SUBCUTANEOUS
  Filled 2013-07-20 (×3): qty 0.7

## 2013-07-20 MED ORDER — CLOPIDOGREL BISULFATE 300 MG PO TABS
ORAL_TABLET | ORAL | Status: AC
  Filled 2013-07-20: qty 1

## 2013-07-20 MED ORDER — OXYCODONE HCL 5 MG PO TABS: 15.0000 mg | ORAL_TABLET | Freq: Four times a day (QID) | ORAL | Status: DC | PRN

## 2013-07-20 MED ORDER — METFORMIN HCL 500 MG PO TABS
500.0000 mg | ORAL_TABLET | Freq: Two times a day (BID) | ORAL | Status: DC
  Filled 2013-07-20: qty 1

## 2013-07-20 MED ORDER — FENOFIBRATE 160 MG PO TABS
160.0000 mg | ORAL_TABLET | Freq: Every day | ORAL | Status: DC
  Administered 2013-07-20 – 2013-07-21 (×2): 160 mg via ORAL
  Filled 2013-07-20 (×2): qty 1

## 2013-07-20 MED ORDER — ASPIRIN 81 MG PO CHEW
81.0000 mg | CHEWABLE_TABLET | Freq: Every day | ORAL | Status: DC
  Administered 2013-07-21: 09:00:00 81 mg via ORAL
  Filled 2013-07-20: qty 1

## 2013-07-20 MED ORDER — SODIUM CHLORIDE 0.9 % IV SOLN
INTRAVENOUS | Status: DC
  Administered 2013-07-20: 10:00:00 via INTRAVENOUS

## 2013-07-20 MED ORDER — ASPIRIN EC 81 MG PO TBEC: 81.0000 mg | DELAYED_RELEASE_TABLET | Freq: Every day | ORAL | Status: DC

## 2013-07-20 MED ORDER — INFLUENZA VAC SPLIT QUAD 0.5 ML IM SUSP
0.5000 mL | Freq: Once | INTRAMUSCULAR | Status: AC
  Administered 2013-07-21: 10:00:00 0.5 mL via INTRAMUSCULAR
  Filled 2013-07-20: qty 0.5

## 2013-07-20 MED ORDER — ASPIRIN 81 MG PO CHEW: 81.0000 mg | CHEWABLE_TABLET | ORAL | Status: DC

## 2013-07-20 MED ORDER — SODIUM CHLORIDE 0.9 % IV SOLN: 250.0000 mL | INTRAVENOUS | Status: DC | PRN

## 2013-07-20 MED ORDER — HEPARIN (PORCINE) IN NACL 2-0.9 UNIT/ML-% IJ SOLN
INTRAMUSCULAR | Status: AC
  Filled 2013-07-20: qty 1000

## 2013-07-20 MED ORDER — SODIUM CHLORIDE 0.9 % IV SOLN: 1.0000 mL/kg/h | INTRAVENOUS | Status: AC

## 2013-07-20 MED ORDER — NITROGLYCERIN 0.2 MG/ML ON CALL CATH LAB
INTRAVENOUS | Status: AC
  Filled 2013-07-20: qty 1

## 2013-07-20 MED ORDER — SODIUM CHLORIDE 0.9 % IJ SOLN: 3.0000 mL | Freq: Two times a day (BID) | INTRAMUSCULAR | Status: DC

## 2013-07-20 MED ORDER — BIVALIRUDIN 250 MG IV SOLR
INTRAVENOUS | Status: AC
  Filled 2013-07-20: qty 250

## 2013-07-20 MED ORDER — SODIUM CHLORIDE 0.9 % IJ SOLN: 3.0000 mL | INTRAMUSCULAR | Status: DC | PRN

## 2013-07-20 MED ORDER — LIDOCAINE HCL (PF) 1 % IJ SOLN
INTRAMUSCULAR | Status: AC
  Filled 2013-07-20: qty 30

## 2013-07-20 NOTE — CV Procedure (Addendum)
PROCEDURE:  Left heart catheterization with selective coronary angiography, left ventriculogram.  PCI of the proximal right coronary artery; Abdominal aortogram.  INDICATIONS:  Abnormal stress test, class III angina  The risks, benefits, and details of the procedure were explained to the patient.  The patient verbalized understanding and wanted to proceed.  Informed written consent was obtained.  PROCEDURE TECHNIQUE:  After Xylocaine anesthesia a 5F sheath was placed in the right femoral artery with a single anterior needle wall stick.   Left coronary angiography was done using a Judkins L4 guide catheter.  Right coronary angiography was done using a Judkins R4 guide catheter.  Left ventriculography was done using a pigtail catheter.    CONTRAST:  Total of 130 cc.  COMPLICATIONS:  None.    HEMODYNAMICS:  Aortic pressure was 112/72; LV pressure was 116/16; LVEDP 24.  There was no gradient between the left ventricle and aorta.    ANGIOGRAPHIC DATA:   The left main coronary artery is a large vessel which is widely patent.  The left anterior descending artery is a large vessel which wraps around the apex. The mid LAD stent is widely patent. The first diagonal is small but patent. The second diagonal is medium-sized with mild proximal disease. The third diagonal is small but patent.  The left circumflex artery is medium size vessel. There 2 medium-size obtuse marginal vessel with only mild atherosclerosis. There is a very long atrial branch. The remainder of the circumflex is small.  The right coronary artery is a large dominant vessel. The stents in the RCA appear patent with only mild in-stent restenosis. In the proximal portion of the vessel, there is a focal 80% stenosis. This is before the bend leading to the mid RCA. The posterior lateral artery is large and extends across the lateral wall is widely patent. The posterior descending artery is large and patent. The ostium of the posterior  descending artery, there is an 80% stenosis.  LEFT VENTRICULOGRAM:  Left ventricular angiogram was done in the 30 RAO projection and revealed normal left ventricular wall motion and systolic function with an estimated ejection fraction of 55 %.  LVEDP was 24 mmHg.  ABDOMINAL AORTOGRAM: There is no abdominal aortic aneurysm. There appears to be dual arterial supply to both kidneys.  PCI NARRATIVE: A JR 4 guide catheter was used to engage the right coronary artery. Angiomax was used for anticoagulation. 2.5 x 15 balloon was used to predilate the RCA. A 3.5 x 16 from his drug-eluting stent was deployed. A 4.0 x 12 noncompliant balloon was used to post dilate the stent. There was an excellent angiographic result. Intra-coronary nitroglycerin was administered. The patient tolerated the procedure well.  Right femoral angiogram was performed. There was moderate atherosclerosis in the right Femoral artery so manual compression was used for hemostasis.    IMPRESSIONS:  1. Normal left main coronary artery. 2. Patent stent in the mid left anterior descending artery. 3. Widely patent left circumflex artery and its branches. 4. 80% proximal right coronary artery stenosis.  The stents in the mid and distal RCA are patent with only mild in-stent restenosis. Just before the distal stent, there is a moderate lesion.  The proximal RCA was successfully stented with a 3.5 x 16 promus drug-eluting stent, postdilated to 4.2 mm proximally.  5. Normal left ventricular systolic function.  LVEDP 24 mmHg.  Ejection fraction 55%. 6.  No abdominal aortic aneurysm.  RECOMMENDATION:  Continue dual antiplatelet therapy for at least a  year. He'll need aggressive secondary prevention as well including weight loss. He'll be watched overnight.  Probable discharge tomorrow if there are no complications.  His LVEDP was elevated at the time of cath.  Consider a dose of IV Lasix in the morning before discharge if his renal function  is okay. This may also help with his dyspnea.

## 2013-07-20 NOTE — Progress Notes (Signed)
Site area: right groin  Site Prior to Removal:  Level 0  Pressure Applied For 20 MINUTES    Minutes Beginning at 1645  Manual:   yes  Patient Status During Pull:  stable  Post Pull Groin Site:  Level 0  Post Pull Instructions Given:  yes  Post Pull Pulses Present:  yes  Dressing Applied:  yes  Comments:

## 2013-07-20 NOTE — Interval H&P Note (Signed)
Cath Lab Visit (complete for each Cath Lab visit)  Clinical Evaluation Leading to the Procedure:   ACS: no  Non-ACS:    Anginal Classification: CCS III  Anti-ischemic medical therapy: Minimal Therapy (1 class of medications)  Non-Invasive Test Results: Intermediate-risk stress test findings: cardiac mortality 1-3%/year  Prior CABG: No previous CABG      History and Physical Interval Note:  07/20/2013 1:23 PM  Carl Mccoy  has presented today for surgery, with the diagnosis of c/p  The various methods of treatment have been discussed with the patient and family. After consideration of risks, benefits and other options for treatment, the patient has consented to  Procedure(s): LEFT HEART CATHETERIZATION WITH CORONARY ANGIOGRAM (N/A) as a surgical intervention .  The patient's history has been reviewed, patient examined, no change in status, stable for surgery.  I have reviewed the patient's chart and labs.  Questions were answered to the patient's satisfaction.     Carl Mccoy S.
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
## 2013-07-21 DIAGNOSIS — R0989 Other specified symptoms and signs involving the circulatory and respiratory systems: Secondary | ICD-10-CM

## 2013-07-21 DIAGNOSIS — I25810 Atherosclerosis of coronary artery bypass graft(s) without angina pectoris: Secondary | ICD-10-CM

## 2013-07-21 DIAGNOSIS — I2510 Atherosclerotic heart disease of native coronary artery without angina pectoris: Secondary | ICD-10-CM

## 2013-07-21 DIAGNOSIS — R9439 Abnormal result of other cardiovascular function study: Secondary | ICD-10-CM

## 2013-07-21 DIAGNOSIS — R0609 Other forms of dyspnea: Secondary | ICD-10-CM

## 2013-07-21 DIAGNOSIS — I10 Essential (primary) hypertension: Secondary | ICD-10-CM

## 2013-07-21 LAB — CBC
HCT: 35.4 % — ABNORMAL LOW (ref 39.0–52.0)
Hemoglobin: 12.1 g/dL — ABNORMAL LOW (ref 13.0–17.0)
MCH: 29.9 pg (ref 26.0–34.0)
MCHC: 34.2 g/dL (ref 30.0–36.0)
MCV: 87.4 fL (ref 78.0–100.0)
Platelets: 121 10*3/uL — ABNORMAL LOW (ref 150–400)
RBC: 4.05 MIL/uL — AB (ref 4.22–5.81)
RDW: 13.1 % (ref 11.5–15.5)
WBC: 4.5 10*3/uL (ref 4.0–10.5)

## 2013-07-21 LAB — BASIC METABOLIC PANEL
BUN: 26 mg/dL — ABNORMAL HIGH (ref 6–23)
CALCIUM: 8.7 mg/dL (ref 8.4–10.5)
CO2: 23 mEq/L (ref 19–32)
Chloride: 103 mEq/L (ref 96–112)
Creatinine, Ser: 0.75 mg/dL (ref 0.50–1.35)
GFR calc Af Amer: >90 mL/min (ref >90)
Glucose, Bld: 194 mg/dL — ABNORMAL HIGH (ref 70–99)
Potassium: 3.7 mEq/L (ref 3.7–5.3)
SODIUM: 138 meq/L (ref 137–147)

## 2013-07-21 LAB — GLUCOSE, CAPILLARY: GLUCOSE-CAPILLARY: 133 mg/dL — AB (ref 70–99)

## 2013-07-21 MED ORDER — NITROGLYCERIN 0.4 MG SL SUBL: 0.4000 mg | SUBLINGUAL_TABLET | SUBLINGUAL | Status: DC | PRN

## 2013-07-21 NOTE — Discharge Summary (Signed)
Physician Discharge Summary  Patient ID: Carl Mccoy MRN: [id number] DOB/AGE: 1956/03/11 58 y.o.  Admit date: 07/20/2013 Discharge date: 07/21/2013  Admission Diagnoses: Abnormal Nuclear Cardiac Stress Study.   Discharge Diagnoses:  Active Problems:   Nonspecific abnormal unspecified cardiovascular function study   CAD - s/p restenting of RCA for mild IRS- 07/20/13   Discharged Condition: stable  Hospital Course: Carl Mccoy is a 58 y/o male, followed by Dr. Domenic Polite. He has a history of RCA disease with stents to the right coronary artery in October of 2014. The stent is located as proximally and distally. He also has a stent to his LAD. He has been on DAPT with ASA and Plavix. He was seen recently in clinic by Jory Sims, NP, and complained of DOE. She arranged for him to undergo nuclear stress testing. Lexiscan Cardiolite from March 3 reported a moderate sized inferior defect consistent with scar and moderate peri-infarct ischemia, LVEF 49% with inferior hypokinesis. This was reviewed by Dr. Domenic Polite and he recommended a LHC.  The patient presented to [facility] on [facility] to undergo the planned procedure. It was performed by Dr. Irish Lack, via the right femoral artery. He was found to have mild IRS in the proximal segment of the previously placed RCA stent. This was successfuly treated with a DES. The preveously placed LAD stent was patent. Stystolic function was normal with an EF of 55%.  It was also noted that his left ventricular end-diastolic pressure was elevated at 24 at time of cath. This was felt to be related to holding his HCTZ, prior to cath.  He left the cath lab in stable condition. He had no post cath complications. The right femoral access site remained stable, free from hematoma and bruit. His BP was slightly elevated and he was restarted on his HCTZ. Cardiac rehab was consulted to assist with ambulation. He had no further DOE. He was last seen and examined by Dr. Stanford Breed, who  determined he was stable for discharge home. He will f/u with Dr. Domenic Polite in 2-4 weeks.   Consults: None  Significant Diagnostic Studies:   LHC 07/20/13 IMPRESSIONS:  1. Normal left main coronary artery. 2. Patent stent in the mid left anterior descending artery. 3. Widely patent left circumflex artery and its branches. 4. 80% proximal right coronary artery stenosis. The stents in the mid and distal RCA are patent with only mild in-stent restenosis. Just before the distal stent, there is a moderate lesion. The proximal RCA was successfully stented with a 3.5 x 16 promus drug-eluting stent, postdilated to 4.2 mm proximally.  5. Normal left ventricular systolic function. LVEDP 24 mmHg. Ejection fraction 55%. 6. No abdominal aortic aneurysm.    Treatments: See Hospital Course  Discharge Exam: Blood pressure 159/96, pulse 70, temperature 97.3 F (36.3 C), temperature source Oral, resp. rate 17, height 5\' 11"  (1.803 m), weight 259 lb 7.7 oz (117.7 kg), SpO2 98.00%.   Disposition: 01-Home or Self Care      Discharge Orders   Future Orders Complete By Expires   Diet - low sodium heart healthy  As directed    Driving Restrictions  As directed    Comments:     No driving for 3 days   Increase activity slowly  As directed    Lifting restrictions  As directed    Comments:     No lifting more than 1/2 gallon of milk for 3 days       Medication List  aspirin EC 81 MG tablet  Take 81 mg by mouth daily.     clopidogrel 75 MG tablet  Commonly known as:  PLAVIX  Take 1 tablet (75 mg total) by mouth daily with breakfast.     fenofibrate 160 MG tablet  Take 160 mg by mouth daily.     insulin glargine 100 UNIT/ML injection  Commonly known as:  LANTUS  Inject 70 Units into the skin 2 (two) times daily.     lisinopril-hydrochlorothiazide 20-25 MG per tablet  Commonly known as:  PRINZIDE,ZESTORETIC  Take 1 tablet by mouth daily.     metFORMIN 500 MG tablet  Commonly  known as:  GLUCOPHAGE  Take 1 tablet (500 mg total) by mouth 2 (two) times daily with a meal.     nitroGLYCERIN 0.4 MG SL tablet  Commonly known as:  NITROSTAT  Place 1 tablet (0.4 mg total) under the tongue every 5 (five) minutes as needed for chest pain.     oxyCODONE 15 MG immediate release tablet  Commonly known as:  ROXICODONE  Take 15 mg by mouth every 6 (six) hours as needed for pain.     pantoprazole 40 MG tablet  Commonly known as:  PROTONIX  Take 40 mg by mouth daily.     rosuvastatin 5 MG tablet  Commonly known as:  CRESTOR  Take 1 tablet (5 mg total) by mouth daily.     VISINE OP  Place 2 drops into both eyes daily as needed (itching).       Follow-up Information   Follow up with Rozann Lesches, MD. (our office will call you with a follow up appointment )    Specialty:  Cardiology   Contact information:   [place] [address] [place], INCLUDING PHYSICIAN TIME: > 30 MINTUES  Signed: Anila Bojarski 07/21/2013, 11:31 AM

## 2013-07-21 NOTE — Progress Notes (Addendum)
Subjective: Denies further SOB. No chest pain. Denies right groin, back and flank pain.   Objective: Vital signs in last 24 hours: Temp:  [97.3 F (36.3 C)-97.8 F (36.6 C)] 97.3 F (36.3 C) (03/21 0736) Pulse Rate:  [55-74] 70 (03/21 0736) Resp:  [15-20] 17 (03/21 0736) BP: (94-162)/(54-96) 159/96 mmHg (03/21 0736) SpO2:  [97 %-100 %] 98 % (03/21 0736) Weight:  [259 lb 7.7 oz (117.7 kg)-265 lb (120.203 kg)] 259 lb 7.7 oz (117.7 kg) (03/20 2326) Last BM Date: 09/18/13  Intake/Output from previous day: 03/20 0701 - 03/21 0700 In: 800.4 [P.O.:560; I.V.:240.4] Out: 1425 [Urine:1425] Intake/Output this shift: Total I/O In: -  Out: 300 [Urine:300]  Medications Current Facility-Administered Medications  Medication Dose Route Frequency Provider Last Rate Last Dose  . aspirin chewable tablet 81 mg  81 mg Oral Daily Casandra Doffing, MD      . atorvastatin (LIPITOR) tablet 10 mg  10 mg Oral q1800 Casandra Doffing, MD   10 mg at 07/20/13 1805  . clopidogrel (PLAVIX) tablet 75 mg  75 mg Oral Q breakfast Casandra Doffing, MD   75 mg at 07/21/13 0757  . fenofibrate tablet 160 mg  160 mg Oral Daily Casandra Doffing, MD   160 mg at 07/20/13 1805  . influenza vac split quadrivalent PF (FLUARIX) injection 0.5 mL  0.5 mL Intramuscular Once Casandra Doffing, MD      . insulin glargine (LANTUS) injection 70 Units  70 Units Subcutaneous BID Casandra Doffing, MD   70 Units at 07/20/13 2138  . [START ON 07/22/2013] metFORMIN (GLUCOPHAGE) tablet 500 mg  500 mg Oral BID WC Casandra Doffing, MD      . nitroGLYCERIN (NITROSTAT) SL tablet 0.4 mg  0.4 mg Sublingual Q5 min PRN Casandra Doffing, MD      . oxyCODONE (Oxy IR/ROXICODONE) immediate release tablet 15 mg  15 mg Oral Q6H PRN Casandra Doffing, MD      . pantoprazole (PROTONIX) EC tablet 40 mg  40 mg Oral Daily Casandra Doffing, MD   40 mg at 07/20/13 1804    PE: General appearance: alert, cooperative and no distress Lungs: clear to auscultation bilaterally Heart: regular rate and  rhythm, S1, S2 normal, no murmur, click, rub or gallop Extremities: no LEE, + right lower extremity prosthesis  Pulses: 2+ and symmetric Skin: warm and dry Neurologic: Grossly normal  Lab Results:   Recent Labs  07/21/13 0350  WBC 4.5  HGB 12.1*  HCT 35.4*  PLT 121*   BMET  Recent Labs  07/21/13 0350  NA 138  K 3.7  CL 103  CO2 23  GLUCOSE 194*  BUN 26*  CREATININE 0.75  CALCIUM 8.7   PT/INR  Recent Labs  07/20/13 1012  LABPROT 13.2  INR 1.02   Cholesterol No results found for this basename: CHOL,  in the last 72 hours Cardiac Enzymes No components found with this basename: TROPONIN,  CKMB,   Studies/Results:  LHC 07/20/13   IMPRESSIONS:  1. Normal left main coronary artery. 2. Patent stent in the mid left anterior descending artery. 3. Widely patent left circumflex artery and its branches. 4. 80% proximal right coronary artery stenosis. The stents in the mid and distal RCA are patent with only mild in-stent restenosis. Just before the distal stent, there is a moderate lesion. The proximal RCA was successfully stented with a 3.5 x 16 promus drug-eluting stent, postdilated to 4.2 mm proximally.  5. Normal left ventricular systolic function. LVEDP 24 mmHg.  Ejection fraction 55%. 6. No abdominal aortic aneurysm.   Assessment/Plan    Active Problems:   Nonspecific abnormal unspecified cardiovascular function study  Plan: Day 1 s/p stenting of proximal RCA for mild ISR. The previously placed LAD stent was patent. Systolic function was normal with EF of 55%. He denies any further SOB. No chest pain, groin, back and flank pain. Right femoral access site is stable, free from hematoma and bruit. HR, BP and renal function all stable. Will have patient ambulate today. If ok ambulating, will plan for discharge home today. Will continue DAPT with ASA + Plavix and statin. Hold Metformin today. MD to follow.     LOS: 1 day    Brittainy M. Rosita Fire,  PA-C 07/21/2013 8:11 AM  As above, patient seen and examined. He denies chest pain. Some dyspnea on exertion. Right groin with no hematoma and no bruit. Plan discharge today on dual antiplatelet therapy and statin. Resume lisinopril HCT for blood pressure. It was noted that his left ventricular end-diastolic pressure was elevated at 24 at time of cath. This may be related to holding his HCTZ. If his dyspnea persists then low-dose Lasix may be appropriate to see if this improves his symptoms. His dyspnea may be related to diastolic dysfunction. Followup with Dr. Domenic Polite 2-4 weeks after discharge. Hold Glucophage for 48 hours following catheterization. Note telemetry reviewed and there were 2 nonconducted P waves noted. Is not having syncope. No further intervention at this time. >30 min PA and physician time D2 Kirk Ruths

## 2013-07-21 NOTE — Discharge Summary (Signed)
See progress notes Brian Crenshaw  

## 2013-07-21 NOTE — Discharge Instructions (Signed)
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

## 2013-07-21 NOTE — Progress Notes (Addendum)
CARDIAC REHAB PHASE I   PRE:  Rate/Rhythm: Sinus Rhythm 72  BP:    Sitting: 148/88    SaO2: 98% Room Air  MODE:  Ambulation: 400 ft   POST:  Rate/Rhythem: Sinus Rhythm 80  BP:    Sitting: 152/80     SaO2: 97 Room air  [phone]  Patient ambulated independently in the hallway using his R lower leg prosthesis without difficulty or complaints. Reviewed stent card heart healthy diabetic diet and exercise instructions with the patient. Carl Mccoy is not interested in outpatient cardiac rehab at this time.    [place]att[place] Alia, Christa See RN BSN

## 2013-07-23 ENCOUNTER — Encounter: Payer: Self-pay | Admitting: *Deleted

## 2013-07-23 ENCOUNTER — Telehealth: Payer: Self-pay | Admitting: Cardiology

## 2013-07-23 MED FILL — Sodium Chloride IV Soln 0.9%: INTRAVENOUS | Qty: 50 | Status: AC

## 2013-07-23 NOTE — Telephone Encounter (Signed)
Status: Signed       Patient had cath done on 07/20/13 and stent placed. Patient states that he needs to return to work. Given an appointment with Curt Bears for 3/30. Patient states that he is not going to see her. Dr.McDowell does not have available appointment until 4/8. Please advise. / tgs              Please advise,pt is adamant to have written documentation to return to work

## 2013-07-23 NOTE — Telephone Encounter (Signed)
Patient had cath done on 07/20/13 and stent placed.  Patient states that he needs to return to work.  Given an appointment with Curt Bears for 3/30.  Patient states that he is not going to see her.  Dr.McDowell does not have available appointment until 4/8.  Please advise. / tgs

## 2013-07-23 NOTE — Telephone Encounter (Signed)
I reviewed the discharge summary. Patient had a DES intervention to in-stent restenosis within the RCA. No acute coronary syndrome associated with this. As long as he complied with the post catheterization restrictions for 3 days, he should be able to go ahead and return to work. I can see him back as planned in the next 2-4 weeks. If he refuses to see Ms. Lawrence NP, please make sure that he is on my schedule.

## 2013-07-30 ENCOUNTER — Encounter: Payer: Medicare Other | Admitting: Adult Health

## 2013-08-02 ENCOUNTER — Ambulatory Visit (HOSPITAL_COMMUNITY)
Admission: RE | Admit: 2013-08-02 | Discharge: 2013-08-02 | Disposition: A | Discharge status: Home / Self Care | Payer: Medicare Other | Source: Ambulatory Visit | Attending: Cardiology | Admitting: Cardiology

## 2013-08-02 DIAGNOSIS — R9439 Abnormal result of other cardiovascular function study: Secondary | ICD-10-CM

## 2013-08-02 DIAGNOSIS — I2510 Atherosclerotic heart disease of native coronary artery without angina pectoris: Secondary | ICD-10-CM | POA: Insufficient documentation

## 2013-08-02 DIAGNOSIS — Z9889 Other specified postprocedural states: Secondary | ICD-10-CM | POA: Insufficient documentation

## 2013-08-17 ENCOUNTER — Ambulatory Visit: Payer: Medicare Other | Admitting: Cardiology

## 2013-08-20 ENCOUNTER — Encounter: Payer: Medicare Other | Admitting: Cardiology

## 2013-08-24 ENCOUNTER — Encounter: Payer: Medicare Other | Admitting: Cardiology

## 2013-08-24 ENCOUNTER — Encounter: Payer: Self-pay | Admitting: Cardiology

## 2013-08-24 ENCOUNTER — Encounter: Payer: Self-pay | Admitting: *Deleted

## 2013-08-24 NOTE — Progress Notes (Signed)
NNo-show. This encounter was created in error - please disregard.

## 2013-09-19 ENCOUNTER — Encounter: Payer: Medicare Other | Admitting: Cardiology

## 2013-09-19 ENCOUNTER — Encounter: Payer: Self-pay | Admitting: Cardiology

## 2013-09-19 NOTE — Progress Notes (Signed)
NNo-show. This encounter was created in error - please disregard. 

## 2013-09-28 ENCOUNTER — Encounter: Payer: Self-pay | Admitting: *Deleted

## 2013-11-09 ENCOUNTER — Ambulatory Visit (INDEPENDENT_AMBULATORY_CARE_PROVIDER_SITE_OTHER): Payer: Medicare Other | Admitting: Cardiology

## 2013-11-09 ENCOUNTER — Encounter: Payer: Self-pay | Admitting: Cardiology

## 2013-11-09 VITALS — BP 150/98 | HR 95 | Ht 71.0 in | Wt 261.0 lb

## 2013-11-09 DIAGNOSIS — E782 Mixed hyperlipidemia: Secondary | ICD-10-CM

## 2013-11-09 DIAGNOSIS — I10 Essential (primary) hypertension: Secondary | ICD-10-CM

## 2013-11-09 DIAGNOSIS — I2510 Atherosclerotic heart disease of native coronary artery without angina pectoris: Secondary | ICD-10-CM

## 2013-11-09 NOTE — Assessment & Plan Note (Signed)
Blood pressure is elevated. He just came to the office after work in the heat. States that he has been taking his medications. Weight loss and exercise would be beneficial. Keep follow up with Dr. Legrand Rams.

## 2013-11-09 NOTE — Assessment & Plan Note (Signed)
He continues on Crestor. Recommended followup with Dr. Legrand Rams for physical and lab work.
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
## 2013-11-09 NOTE — Assessment & Plan Note (Signed)
Symptomatically stable status post DES to the proximal RCA in March of this year. Continue medical therapy including DAPT. Followup arranged in 6 months.

## 2013-11-09 NOTE — Patient Instructions (Signed)
Your physician wants you to follow-up in: 6 months with Dr. McDowell You will receive a reminder letter in the mail two months in advance. If you don't receive a letter, please call our office to schedule the follow-up appointment.  Your physician recommends that you continue on your current medications as directed. Please refer to the Current Medication list given to you today.  Thank you for choosing [facility]!!    

## 2014-02-20 ENCOUNTER — Other Ambulatory Visit (HOSPITAL_COMMUNITY): Payer: Self-pay | Admitting: Nurse Practitioner

## 2014-03-26 ENCOUNTER — Other Ambulatory Visit: Payer: Self-pay | Admitting: Cardiology

## 2014-04-11 ENCOUNTER — Encounter (HOSPITAL_COMMUNITY): Payer: Self-pay | Admitting: Cardiovascular Disease

## 2014-04-19 ENCOUNTER — Encounter: Payer: Self-pay | Admitting: Cardiology

## 2014-04-19 ENCOUNTER — Ambulatory Visit (INDEPENDENT_AMBULATORY_CARE_PROVIDER_SITE_OTHER): Payer: Commercial Managed Care - HMO | Admitting: Cardiology

## 2014-04-19 VITALS — BP 156/90 | HR 81 | Ht 71.0 in | Wt 265.0 lb

## 2014-04-19 DIAGNOSIS — E782 Mixed hyperlipidemia: Secondary | ICD-10-CM

## 2014-04-19 DIAGNOSIS — I2510 Atherosclerotic heart disease of native coronary artery without angina pectoris: Secondary | ICD-10-CM

## 2014-04-19 NOTE — Assessment & Plan Note (Signed)
No active angina symptoms. Continues on aspirin, Plavix, Prinzide, and Crestor. No changes to current regimen. Follow-up in 6 months.

## 2014-04-19 NOTE — Assessment & Plan Note (Signed)
Due for follow-up lab work with Dr. Legrand Rams. He continues on Crestor without side effects.
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
## 2014-04-19 NOTE — Patient Instructions (Signed)
Your physician wants you to follow-up in: 6 months. You will receive a reminder letter in the mail two months in advance. If you don't receive a letter, please call our office to schedule the follow-up appointment.  Your physician recommends that you continue on your current medications as directed. Please refer to the Current Medication list given to you today.   Thank you for choosing [facility]!    

## 2014-04-22 NOTE — Addendum Note (Signed)
Addended by: Barbarann Ehlers A on: 04/22/2014 01:22 PM   Modules accepted: Orders

## 2014-05-17 ENCOUNTER — Encounter: Payer: Medicare Other | Admitting: Cardiology

## 2014-06-17 ENCOUNTER — Other Ambulatory Visit: Payer: Self-pay | Admitting: Adult Health

## 2014-07-09 ENCOUNTER — Emergency Department (HOSPITAL_COMMUNITY): Payer: Commercial Managed Care - HMO

## 2014-07-09 ENCOUNTER — Encounter (HOSPITAL_COMMUNITY): Payer: Self-pay | Admitting: Emergency Medicine

## 2014-07-09 ENCOUNTER — Emergency Department (HOSPITAL_COMMUNITY)
Admission: EM | Admit: 2014-07-09 | Discharge: 2014-07-09 | Disposition: A | Discharge status: Home / Self Care | Payer: Commercial Managed Care - HMO | Attending: Emergency Medicine | Admitting: Emergency Medicine

## 2014-07-09 DIAGNOSIS — M1990 Unspecified osteoarthritis, unspecified site: Secondary | ICD-10-CM | POA: Insufficient documentation

## 2014-07-09 DIAGNOSIS — Z9981 Dependence on supplemental oxygen: Secondary | ICD-10-CM | POA: Insufficient documentation

## 2014-07-09 DIAGNOSIS — E119 Type 2 diabetes mellitus without complications: Secondary | ICD-10-CM | POA: Insufficient documentation

## 2014-07-09 DIAGNOSIS — Z792 Long term (current) use of antibiotics: Secondary | ICD-10-CM | POA: Insufficient documentation

## 2014-07-09 DIAGNOSIS — R05 Cough: Secondary | ICD-10-CM | POA: Diagnosis present

## 2014-07-09 DIAGNOSIS — J209 Acute bronchitis, unspecified: Secondary | ICD-10-CM | POA: Diagnosis not present

## 2014-07-09 DIAGNOSIS — K219 Gastro-esophageal reflux disease without esophagitis: Secondary | ICD-10-CM | POA: Insufficient documentation

## 2014-07-09 DIAGNOSIS — I2510 Atherosclerotic heart disease of native coronary artery without angina pectoris: Secondary | ICD-10-CM | POA: Diagnosis not present

## 2014-07-09 DIAGNOSIS — Z87891 Personal history of nicotine dependence: Secondary | ICD-10-CM | POA: Diagnosis not present

## 2014-07-09 DIAGNOSIS — Z794 Long term (current) use of insulin: Secondary | ICD-10-CM | POA: Diagnosis not present

## 2014-07-09 DIAGNOSIS — Z7902 Long term (current) use of antithrombotics/antiplatelets: Secondary | ICD-10-CM | POA: Diagnosis not present

## 2014-07-09 DIAGNOSIS — Z7982 Long term (current) use of aspirin: Secondary | ICD-10-CM | POA: Diagnosis not present

## 2014-07-09 DIAGNOSIS — G4733 Obstructive sleep apnea (adult) (pediatric): Secondary | ICD-10-CM | POA: Diagnosis not present

## 2014-07-09 DIAGNOSIS — Z79899 Other long term (current) drug therapy: Secondary | ICD-10-CM | POA: Insufficient documentation

## 2014-07-09 DIAGNOSIS — Z8639 Personal history of other endocrine, nutritional and metabolic disease: Secondary | ICD-10-CM | POA: Diagnosis not present

## 2014-07-09 DIAGNOSIS — I10 Essential (primary) hypertension: Secondary | ICD-10-CM | POA: Insufficient documentation

## 2014-07-09 MED ORDER — GUAIFENESIN-CODEINE 100-10 MG/5ML PO SYRP: 5.0000 mL | ORAL_SOLUTION | Freq: Three times a day (TID) | ORAL | Status: DC | PRN

## 2014-07-09 MED ORDER — PREDNISONE 20 MG PO TABS: 40.0000 mg | ORAL_TABLET | Freq: Every day | ORAL | Status: DC

## 2014-07-09 MED ORDER — HYDROCOD POLST-CHLORPHEN POLST 10-8 MG/5ML PO LQCR
5.0000 mL | Freq: Once | ORAL | Status: AC
  Administered 2014-07-09: 5 mL via ORAL
  Filled 2014-07-09: qty 5

## 2014-07-09 MED ORDER — ALBUTEROL SULFATE HFA 108 (90 BASE) MCG/ACT IN AERS
2.0000 | INHALATION_SPRAY | RESPIRATORY_TRACT | Status: DC | PRN
  Administered 2014-07-09: 2 via RESPIRATORY_TRACT
  Filled 2014-07-09: qty 6.7

## 2014-07-09 MED ORDER — AZITHROMYCIN 250 MG PO TABS: ORAL_TABLET | ORAL | Status: DC

## 2014-07-09 MED ORDER — DEXAMETHASONE SODIUM PHOSPHATE 4 MG/ML IJ SOLN
8.0000 mg | Freq: Once | INTRAMUSCULAR | Status: AC
  Administered 2014-07-09: 8 mg via INTRAMUSCULAR
  Filled 2014-07-09: qty 2
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
## 2014-07-09 NOTE — ED Notes (Signed)
Pt c/o persistent cough x 2 weeks. "I have to take cough medicine with codeine just to help me sleep".

## 2014-07-09 NOTE — Discharge Instructions (Signed)

## 2015-02-14 ENCOUNTER — Encounter: Payer: Self-pay | Admitting: Family

## 2015-02-14 ENCOUNTER — Ambulatory Visit (INDEPENDENT_AMBULATORY_CARE_PROVIDER_SITE_OTHER): Payer: PPO | Admitting: Family

## 2015-02-14 ENCOUNTER — Ambulatory Visit (INDEPENDENT_AMBULATORY_CARE_PROVIDER_SITE_OTHER)
Admission: RE | Admit: 2015-02-14 | Discharge: 2015-02-14 | Disposition: A | Discharge status: Home / Self Care | Payer: PPO | Source: Ambulatory Visit | Attending: Family | Admitting: Family

## 2015-02-14 ENCOUNTER — Other Ambulatory Visit (INDEPENDENT_AMBULATORY_CARE_PROVIDER_SITE_OTHER): Payer: PPO

## 2015-02-14 VITALS — BP 120/80 | HR 80 | Temp 97.6°F | Resp 18 | Ht 71.0 in | Wt 266.0 lb

## 2015-02-14 DIAGNOSIS — E119 Type 2 diabetes mellitus without complications: Secondary | ICD-10-CM | POA: Diagnosis not present

## 2015-02-14 DIAGNOSIS — Z23 Encounter for immunization: Secondary | ICD-10-CM

## 2015-02-14 DIAGNOSIS — R0602 Shortness of breath: Secondary | ICD-10-CM | POA: Diagnosis not present

## 2015-02-14 DIAGNOSIS — E782 Mixed hyperlipidemia: Secondary | ICD-10-CM

## 2015-02-14 DIAGNOSIS — Z794 Long term (current) use of insulin: Secondary | ICD-10-CM

## 2015-02-14 DIAGNOSIS — G4733 Obstructive sleep apnea (adult) (pediatric): Secondary | ICD-10-CM | POA: Diagnosis not present

## 2015-02-14 DIAGNOSIS — E669 Obesity, unspecified: Secondary | ICD-10-CM | POA: Insufficient documentation

## 2015-02-14 LAB — BASIC METABOLIC PANEL
BUN: 22 mg/dL (ref 6–23)
CALCIUM: 9.6 mg/dL (ref 8.4–10.5)
CO2: 30 mEq/L (ref 19–32)
CREATININE: 0.97 mg/dL (ref 0.40–1.50)
Chloride: 101 mEq/L (ref 96–112)
GFR: 83.94 mL/min (ref >60.00)
GLUCOSE: 258 mg/dL — AB (ref 70–99)
Potassium: 3.9 mEq/L (ref 3.5–5.1)
Sodium: 139 mEq/L (ref 135–145)

## 2015-02-14 LAB — BRAIN NATRIURETIC PEPTIDE: Pro B Natriuretic peptide (BNP): 33 pg/mL (ref 0.0–100.0)

## 2015-02-14 LAB — HEMOGLOBIN A1C: Hgb A1c MFr Bld: 8.7 % — ABNORMAL HIGH (ref 4.6–6.5)

## 2015-02-14 MED ORDER — FENOFIBRATE 160 MG PO TABS: 160.0000 mg | ORAL_TABLET | Freq: Every day | ORAL | Status: DC

## 2015-02-14 MED ORDER — LISINOPRIL-HYDROCHLOROTHIAZIDE 20-25 MG PO TABS: 1.0000 | ORAL_TABLET | Freq: Every day | ORAL | Status: DC

## 2015-02-14 MED ORDER — CLOPIDOGREL BISULFATE 75 MG PO TABS: ORAL_TABLET | ORAL | Status: DC

## 2015-02-14 MED ORDER — ALPRAZOLAM 0.5 MG PO TABS: 0.5000 mg | ORAL_TABLET | Freq: Every evening | ORAL | Status: DC | PRN

## 2015-02-14 MED ORDER — ROSUVASTATIN CALCIUM 5 MG PO TABS: 5.0000 mg | ORAL_TABLET | Freq: Every day | ORAL | Status: DC

## 2015-02-14 MED ORDER — PANTOPRAZOLE SODIUM 40 MG PO TBEC: 40.0000 mg | DELAYED_RELEASE_TABLET | Freq: Every day | ORAL | Status: DC
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
## 2015-02-14 NOTE — Assessment & Plan Note (Signed)
Refill Crestor per patient request. Obtain lipid profile when fasting.

## 2015-02-14 NOTE — Assessment & Plan Note (Signed)
Type 2 diabetes with unknown current status and maintained on Lantus and Metformin. Obtain A1c and BMET. Continue current dosage of Lantus and Metformin pending A1c results. Lisinopril and rosuvastatin are present for CAD risk reduction. Believe his Pneumovax status is up to date as well as his eye exam. Emphasized importance of adequate nutrition and physical activity to assist with blood pressure control. Follow-up pending A1c results.

## 2015-02-14 NOTE — Assessment & Plan Note (Signed)
Unknown data last sleep study for titration of equipment. Refer to pulmonology for sleep study and equipment if needed.

## 2015-02-14 NOTE — Assessment & Plan Note (Signed)
BMI 37 indicating obesity. Discussed importance of interventions through nutrition and physical activity. Start with goal of 5-10% of current body weight loss through improving a transient dense foods and decreasing saturated fats and increasing physical activity to 30 minutes most days of the week as tolerated. Does have limitation secondary to comorbidities, however mild exercise is appropriate to help with his risk for cardiovascular disease as well as diabetes.

## 2015-02-14 NOTE — Patient Instructions (Signed)
Thank you for choosing [facility].  Summary/Instructions:  Continue to take your medications as prescribed.   Your prescription(s) have been submitted to your pharmacy or been printed and provided for you. Please take as directed and contact our office if you believe you are having problem(s) with the medication(s) or have any questions.  Please stop by the lab on the basement level of the building for your blood work. Your results will be released to [place] (or called to you) after review, usually within 72 hours after test completion. If any changes need to be made, you will be notified at that same time.  Please stop by radiology on the basement level of the building for your x-rays. Your results will be released to [place] (or called to you) after review, usually within 72 hours after test completion. If any treatments or changes are necessary, you will be notified at that same time.  If your symptoms worsen or fail to improve, please contact our office for further instruction, or in case of emergency go directly to the emergency room at the closest medical facility.   Exercising to Lose Weight Exercising can help you to lose weight. In order to lose weight through exercise, you need to do vigorous-intensity exercise. You can tell that you are exercising with vigorous intensity if you are breathing very hard and fast and cannot hold a conversation while exercising. Moderate-intensity exercise helps to maintain your current weight. You can tell that you are exercising at a moderate level if you have a higher heart rate and faster breathing, but you are still able to hold a conversation. HOW OFTEN SHOULD I EXERCISE? Choose an activity that you enjoy and set realistic goals. Your health care provider can help you to make an activity plan that works for you. Exercise regularly as directed by your health care provider. This may include:  Doing resistance training twice each week, such  as:  Push-ups.  Sit-ups.  Lifting weights.  Using resistance bands.  Doing a given intensity of exercise for a given amount of time. Choose from these options:  150 minutes of moderate-intensity exercise every week.  75 minutes of vigorous-intensity exercise every week.  A mix of moderate-intensity and vigorous-intensity exercise every week. Children, pregnant women, people who are out of shape, people who are overweight, and older adults may need to consult a health care provider for individual recommendations. If you have any sort of medical condition, be sure to consult your health care provider before starting a new exercise program. WHAT ARE SOME ACTIVITIES THAT CAN HELP ME TO LOSE WEIGHT?   Walking at a rate of at least 4.5 miles an hour.  Jogging or running at a rate of 5 miles per hour.  Biking at a rate of at least 10 miles per hour.  Lap swimming.  Roller-skating or in-line skating.  Cross-country skiing.  Vigorous competitive sports, such as football, basketball, and soccer.  Jumping rope.  Aerobic dancing. HOW CAN I BE MORE ACTIVE IN MY DAY-TO-DAY ACTIVITIES?  Use the stairs instead of the elevator.  Take a walk during your lunch break.  If you drive, park your car farther away from work or school.  If you take public transportation, get off one stop early and walk the rest of the way.  Make all of your phone calls while standing up and walking around.  Get up, stretch, and walk around every 30 minutes throughout the day. WHAT GUIDELINES SHOULD I FOLLOW WHILE EXERCISING?  Do not exercise so much that you hurt yourself, feel dizzy, or get very short of breath.  Consult your health care provider prior to starting a new exercise program.  Wear comfortable clothes and shoes with good support.  Drink plenty of water while you exercise to prevent dehydration or heat stroke. Body water is lost during exercise and must be replaced.  Work out until you  breathe faster and your heart beats faster.   This information is not intended to replace advice given to you by your health care provider. Make sure you discuss any questions you have with your health care provider.   Document Released: 05/22/2010 Document Revised: 05/10/2014 Document Reviewed: 09/20/2013 Elsevier Interactive Patient Education [employer].

## 2015-02-14 NOTE — Progress Notes (Signed)
Pre visit review using our clinic review tool, if applicable. No additional management support is needed unless otherwise documented below in the visit note. 

## 2015-02-14 NOTE — Assessment & Plan Note (Signed)
Shortness of breath of undetermined origin, however cannot rule out obesity or heart failure. Obtain chest x-ray to determine cardiac size and BNP to determine current fluid status. Lung exam is benign with no adventitious sounds. Follow-up pending x-ray results and BNP results.

## 2015-02-16 ENCOUNTER — Telehealth: Payer: Self-pay | Admitting: Family

## 2015-02-16 DIAGNOSIS — R0600 Dyspnea, unspecified: Secondary | ICD-10-CM

## 2015-02-16 NOTE — Telephone Encounter (Signed)
Please inform patient that his BNP which measures the amount of fluid in his body was normal and his x-ray did not show any significant reason for his shortness of breath. Therefore I am going to recommend we send him for a 2D echocardiogram to ensure he does not have diastolic heart failure. He should be hearing about the test soon. For his diabetes his A1c is 8.7. Therefore I am going to recommend we continue with the current metformin and Lantus. I would like him to check his drug formulary for the following medicaitons: Tradjenta or Januvia and then [place], Bydureon or Trulicity or Victoza. This takes the issue of prescribing and seeing what is covered. Please have him let us know and we can prescribe the next step.

## 2015-02-18 NOTE — Telephone Encounter (Signed)
LVM for pt to call back.

## 2015-02-26 ENCOUNTER — Other Ambulatory Visit: Payer: Self-pay | Admitting: Cardiology

## 2015-02-28 NOTE — Telephone Encounter (Signed)
Blood work sent in the mail.

## 2015-03-03 ENCOUNTER — Other Ambulatory Visit: Payer: Self-pay | Admitting: Cardiology

## 2015-03-12 ENCOUNTER — Telehealth: Payer: Self-pay

## 2015-03-12 NOTE — Telephone Encounter (Signed)
Phone call from [facility] to initiate a PA for Crestor 5mg . All questions answered. They will fax [place] with decision.

## 2015-03-14 ENCOUNTER — Ambulatory Visit (HOSPITAL_COMMUNITY): Payer: PPO | Attending: Family

## 2015-03-14 ENCOUNTER — Encounter: Payer: Self-pay | Admitting: Internal Medicine

## 2015-03-14 ENCOUNTER — Other Ambulatory Visit: Payer: Self-pay

## 2015-03-14 ENCOUNTER — Ambulatory Visit (INDEPENDENT_AMBULATORY_CARE_PROVIDER_SITE_OTHER): Payer: PPO | Admitting: Internal Medicine

## 2015-03-14 VITALS — BP 122/78 | HR 73 | Ht 71.0 in | Wt 271.6 lb

## 2015-03-14 DIAGNOSIS — Z87891 Personal history of nicotine dependence: Secondary | ICD-10-CM | POA: Insufficient documentation

## 2015-03-14 DIAGNOSIS — G4733 Obstructive sleep apnea (adult) (pediatric): Secondary | ICD-10-CM

## 2015-03-14 DIAGNOSIS — R0600 Dyspnea, unspecified: Secondary | ICD-10-CM | POA: Diagnosis present

## 2015-03-14 DIAGNOSIS — R0602 Shortness of breath: Secondary | ICD-10-CM

## 2015-03-14 DIAGNOSIS — E119 Type 2 diabetes mellitus without complications: Secondary | ICD-10-CM | POA: Diagnosis not present

## 2015-03-14 DIAGNOSIS — I517 Cardiomegaly: Secondary | ICD-10-CM | POA: Insufficient documentation

## 2015-03-14 DIAGNOSIS — Z6837 Body mass index (BMI) 37.0-37.9, adult: Secondary | ICD-10-CM | POA: Insufficient documentation

## 2015-03-14 DIAGNOSIS — E785 Hyperlipidemia, unspecified: Secondary | ICD-10-CM | POA: Insufficient documentation

## 2015-03-14 NOTE — Progress Notes (Signed)
Subjective:     Patient ID: Carl Mccoy, male   DOB: May 07, 1955,    MRN: [id number]  HPI  80 yowm quit smoking x  1980 and dx with osa about 2005 at wt 250 and owns his own cpap p study by [facility] in [place] remotely referred to pulmonary clinic 03/14/2015  by Dr Elna Breslow for sleep re-eval   03/14/2015 1st [facility] office visit/ Tennile Styles    Epworth score 13  Chief Complaint  Patient presents with  . sleep consult    Referred by Dr. Elna Breslow. Pt currently uses CPAP app 9 hours nightly for about 4 years. Pt states that it has greatly improved his sleeping habits. Pt needs new supplies and to establish with sleep doc.   sleeps around 8 h and feels good when wakes up - very happy with cpap, no snoring or sleep disruption per wife. Main issue is getting new equipment/ maint on machine now that Dr Orson Ape has retired.  No am ha/ gradually gaining wt even on cpap with no fatigue but chronic sense of doe x more than nl pace walking but also occ sense of sob at rest sitting comes and goes away on its own.  No obvious  day to day or daytime variabilty or assoc chronic cough or cp or chest tightness, subjective wheeze overt sinus or hb symptoms. No unusual exp hx or h/o childhood pna/ asthma or knowledge of premature birth.  Sleeping ok without nocturnal  or early am exacerbation  of respiratory  c/o's or need for noct saba. Also denies any obvious fluctuation of symptoms with weather or environmental changes or other aggravating or alleviating factors except as outlined above   Current Medications, Allergies, Complete Past Medical History, Past Surgical History, Family History, and Social History were reviewed in [facility] record.              Review of Systems  Constitutional: Negative.  Negative for fever and unexpected weight change.  HENT: Negative.  Negative for congestion, dental problem, ear pain, nosebleeds, postnasal drip, rhinorrhea, sinus pressure, sneezing,  sore throat and trouble swallowing.   Eyes: Negative.  Negative for redness and itching.  Respiratory: Positive for shortness of breath. Negative for cough, chest tightness and wheezing.   Cardiovascular: Negative.  Negative for palpitations and leg swelling.  Gastrointestinal: Negative.  Negative for nausea and vomiting.  Endocrine: Negative.   Genitourinary: Negative.  Negative for dysuria.  Musculoskeletal: Negative.  Negative for joint swelling.  Skin: Negative.  Negative for rash.  Allergic/Immunologic: Negative.   Neurological: Negative.  Negative for headaches.  Hematological: Bruises/bleeds easily.  Psychiatric/Behavioral: Negative.  Negative for dysphoric mood. The patient is not nervous/anxious.        Objective:   Physical Exam   Amb wm gruff voice  Wt Readings from Last 3 Encounters:  03/14/15 271 lb 9.6 oz (123.197 kg)  02/14/15 266 lb (120.657 kg)  07/09/14 260 lb (117.935 kg)    Vital signs reviewed  HEENT: partial plate top/ nl lower dentition, nl  turbinates, and oropharynx. Nl external ear canals without cough reflex Modified Mallampati Score = 3    NECK :  without JVD/Nodes/TM/ nl carotid upstrokes bilaterally   LUNGS: no acc muscle use, clear to A and P bilaterally without cough on insp or exp maneuvers   CV:  RRR  no s3 or murmur or increase in P2, no edema   ABD:  Quite obese soft and nontender with nl excursion in  the supine position. No bruits or organomegaly, bowel sounds nl  MS:  warm without deformities, calf tenderness, cyanosis or clubbing  SKIN: warm and dry without lesions    NEURO:  alert, approp, no deficits    I personally reviewed images and agree with radiology impression as follows:  CXR:   02/14/15 No active cardiopulmonary disease.          Assessment:

## 2015-03-14 NOTE — Patient Instructions (Signed)
We will schedule you for  cpap maintenance / download and supplies next available per insurance requirements   Weight control is simply a matter of calorie balance which needs to be tilted in your favor by eating less and exercising more.  To get the most out of exercise, you need to be continuously aware that you are short of breath, but never out of breath, for 30 minutes daily. As you improve, it will actually be easier for you to do the same amount of exercise  in  30 minutes so always push to the level where you are short of breath.  If this does not result in gradual weight reduction then I strongly recommend you see a nutritionist with a food diary x 2 weeks so that we can work out a negative calorie balance which is universally effective in steady weight loss programs.  Think of your calorie balance like you do your bank account where in this case you want the balance to go down so you must take in less calories than you burn up.  It's just that simple:  Hard to do, but easy to understand.  Good luck!   Please schedule a follow up visit in 6  months but call sooner if needed to establish with sleep medicine doctor.

## 2015-03-15 ENCOUNTER — Encounter: Payer: Self-pay | Admitting: Internal Medicine

## 2015-03-15 NOTE — Assessment & Plan Note (Signed)
Echo 03/14/15 wnl   Most likely this is just related to obesity/ deconditioning  but with gruff voice and occ sob at rest  also concerned about ACEi effects.  In the best review of chronic cough to date ( NEJM 2016 375 [phone]) ,  ACEi are now felt to cause cough in up to  20% of pts which is a 4 fold increase from previous reports and does not include the variety of non-specific complaints we see in pulmonary clinic in pts on ACEi but previously attributed to copd/asthma to include PNDS, throat and chest congestion, "bronchitis", hoarseness,  unexplained dyspnea and noct "strangling" sensations as well as atypical /refractory GERD symptoms like atypical dysphagia.   The only way to prove this is not an "ACEi Case" is a trial off ACEi x a minimum of 6 weeks then regroup> defer to primary care as pt was referred here just for the sleep issue

## 2015-03-15 NOTE — Assessment & Plan Note (Signed)
Although his snoring and AM HA better on present system his epworth is high and he has gained wt since the original titration so may need another titration or at least a review of a download, not just new hoses/ mask etc.  Will request maint check of present machine and download and if not adequate either an autoset with download or a formal repeat titration

## 2015-03-15 NOTE — Assessment & Plan Note (Signed)
Complicated by aodm/ hbp/ osa   Body mass index is 37.9 kg/(m^2).  No results found for: TSH   Contributing to gerd tendency/ doe/reviewed the need and the process to achieve and maintain neg calorie balance > defer f/u primary care including intermittently monitoring thyroid status

## 2015-03-17 ENCOUNTER — Telehealth: Payer: Self-pay | Admitting: Family

## 2015-03-17 NOTE — Telephone Encounter (Signed)
Please inform patient that his Echocardiogram does show Grade 1 diastolic heart dysfunction which is abnormal relaxation of the hearts muscle during relaxation. This is mild and is generally treated with high blood pressure medications and the best thing at this time is to continue to ensure blood pressure is well controlled which based on previous readings it is. The lisinopril will help to protect his heart and kidneys, therefore please continue to take the medications as prescribed. It is unlikely that this finding alone would lead to shortness of breath.

## 2015-03-18 NOTE — Telephone Encounter (Signed)
Pt states that he is having really bad SOB. He said just talking on the phone with me he was getting SOB. He wants to lose weight and states he has been trying to eat better but not losing weight. Do you think a referral to Pulmonology would be necessary?

## 2015-03-19 NOTE — Telephone Encounter (Signed)
Please have him make a follow up office visit or go to Urgent Care if his symptoms worsen.

## 2015-03-20 ENCOUNTER — Ambulatory Visit: Payer: Commercial Managed Care - HMO | Admitting: Cardiology

## 2015-03-21 ENCOUNTER — Telehealth: Payer: Self-pay | Admitting: Family

## 2015-04-02 ENCOUNTER — Telehealth: Payer: Self-pay | Admitting: Family

## 2015-04-02 MED ORDER — LINAGLIPTIN 5 MG PO TABS: 5.0000 mg | ORAL_TABLET | Freq: Every day | ORAL | Status: DC

## 2015-04-02 NOTE — Telephone Encounter (Signed)
Pt has called regarding the letter you sent out asking to check on his drug formulary: Tradjenta, Januvia, and Victoza are all Tier 3 Bydureon is a Tier 4

## 2015-04-02 NOTE — Telephone Encounter (Signed)
Thank you. I have sent Tradjenta to his pharmacy. We do have coupons that are available for him to pick up or print out at [email or web address].  We will plan to follow up in January or sooner if needed.

## 2015-04-03 NOTE — Telephone Encounter (Signed)
Pt aware.

## 2015-04-09 ENCOUNTER — Encounter: Payer: Self-pay | Admitting: Internal Medicine

## 2015-04-11 ENCOUNTER — Ambulatory Visit (INDEPENDENT_AMBULATORY_CARE_PROVIDER_SITE_OTHER): Payer: PPO | Admitting: Cardiology

## 2015-04-11 ENCOUNTER — Encounter: Payer: Self-pay | Admitting: Cardiology

## 2015-04-11 VITALS — BP 140/84 | HR 81 | Ht 71.0 in | Wt 253.0 lb

## 2015-04-11 DIAGNOSIS — I2510 Atherosclerotic heart disease of native coronary artery without angina pectoris: Secondary | ICD-10-CM

## 2015-04-11 DIAGNOSIS — E1159 Type 2 diabetes mellitus with other circulatory complications: Secondary | ICD-10-CM

## 2015-04-11 DIAGNOSIS — R0602 Shortness of breath: Secondary | ICD-10-CM

## 2015-04-11 DIAGNOSIS — E782 Mixed hyperlipidemia: Secondary | ICD-10-CM

## 2015-04-11 DIAGNOSIS — I10 Essential (primary) hypertension: Secondary | ICD-10-CM

## 2015-04-11 NOTE — Patient Instructions (Signed)
Your physician wants you to follow-up in: 6 months with Dr Ferne Reus will receive a reminder letter in the mail two months in advance. If you don't receive a letter, please call our office to schedule the follow-up appointment.   Your physician recommends that you continue on your current medications as directed. Please refer to the Current Medication list given to you today.    Your physician has requested that you have a lexiscan myoview. For further information please visit [email or web address]. Please follow instruction sheet, as given.  PLEASE DO NOT TAKE ANY OF YOUR DIABETIC MEDICINE THE MORNING OF TEST    If you need a refill on your cardiac medications before your next appointment, please call your pharmacy.    Thank you for choosing [facility] !
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
## 2015-04-14 ENCOUNTER — Other Ambulatory Visit: Payer: Self-pay | Admitting: Family

## 2015-04-18 ENCOUNTER — Encounter (HOSPITAL_COMMUNITY): Payer: PPO

## 2015-04-18 ENCOUNTER — Inpatient Hospital Stay (HOSPITAL_COMMUNITY): Admission: RE | Admit: 2015-04-18 | Payer: PPO | Source: Ambulatory Visit

## 2015-04-18 ENCOUNTER — Ambulatory Visit: Payer: PPO | Admitting: Internal Medicine

## 2015-04-19 ENCOUNTER — Ambulatory Visit (INDEPENDENT_AMBULATORY_CARE_PROVIDER_SITE_OTHER): Payer: PPO | Admitting: Internal Medicine

## 2015-04-19 ENCOUNTER — Encounter: Payer: Self-pay | Admitting: Internal Medicine

## 2015-04-19 VITALS — BP 120/70 | HR 81 | Temp 97.5°F | Resp 17 | Ht 71.0 in | Wt 272.0 lb

## 2015-04-19 DIAGNOSIS — J209 Acute bronchitis, unspecified: Secondary | ICD-10-CM | POA: Diagnosis not present

## 2015-04-19 DIAGNOSIS — Z794 Long term (current) use of insulin: Secondary | ICD-10-CM | POA: Diagnosis not present

## 2015-04-19 DIAGNOSIS — E119 Type 2 diabetes mellitus without complications: Secondary | ICD-10-CM | POA: Diagnosis not present

## 2015-04-19 DIAGNOSIS — R0602 Shortness of breath: Secondary | ICD-10-CM | POA: Diagnosis not present

## 2015-04-19 MED ORDER — AZITHROMYCIN 250 MG PO TABS: ORAL_TABLET | ORAL | Status: DC

## 2015-04-19 MED ORDER — METHYLPREDNISOLONE 4 MG PO TBPK: ORAL_TABLET | ORAL | Status: DC

## 2015-04-19 MED ORDER — PROMETHAZINE-CODEINE 6.25-10 MG/5ML PO SYRP: 5.0000 mL | ORAL_SOLUTION | ORAL | Status: DC | PRN

## 2015-04-19 MED ORDER — METHYLPREDNISOLONE ACETATE 80 MG/ML IJ SUSP
80.0000 mg | Freq: Once | INTRAMUSCULAR | Status: AC
Start: 2015-04-19 — End: 2015-04-19
  Administered 2015-04-19: 80 mg via INTRAMUSCULAR

## 2015-04-19 NOTE — Assessment & Plan Note (Addendum)
Recurrent bronchospasm w/URIs  Zpac Depomedrol 80 mg IM Prom-cod syr Medrol pac
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
## 2015-04-19 NOTE — Progress Notes (Signed)
Pre-visit discussion using our clinic review tool. No additional management support is needed unless otherwise documented below in the visit note.  

## 2015-04-19 NOTE — Assessment & Plan Note (Signed)
Metformin, Lantus

## 2015-04-19 NOTE — Assessment & Plan Note (Signed)
Chronic Stress test, Pulm appt pending per pt

## 2015-04-19 NOTE — Patient Instructions (Signed)
Use over-the-counter  "cold" medicines  such as"Afrin" nasal spray for nasal congestion as directed instead. Use " Delsym" or" Robitussin" cough syrup varietis for cough.  You can use plain "Tylenol" or "Advil" for fever, chills and achyness. Use Halls or Ricola cough drops.  Please, make an appointment if you are not better or if you're worse.  

## 2015-04-24 ENCOUNTER — Other Ambulatory Visit: Payer: Self-pay | Admitting: Family

## 2015-04-24 ENCOUNTER — Other Ambulatory Visit: Payer: Self-pay | Admitting: Cardiology

## 2015-04-24 NOTE — Telephone Encounter (Signed)
Last refill was 10/14

## 2015-04-30 ENCOUNTER — Other Ambulatory Visit: Payer: Self-pay | Admitting: Family

## 2015-05-02 ENCOUNTER — Encounter (HOSPITAL_COMMUNITY): Payer: PPO

## 2015-05-02 ENCOUNTER — Inpatient Hospital Stay (HOSPITAL_COMMUNITY): Admission: RE | Admit: 2015-05-02 | Payer: PPO | Source: Ambulatory Visit

## 2015-05-14 DIAGNOSIS — R0602 Shortness of breath: Secondary | ICD-10-CM | POA: Diagnosis not present

## 2015-05-15 ENCOUNTER — Encounter (HOSPITAL_COMMUNITY)
Admission: RE | Admit: 2015-05-15 | Discharge: 2015-05-15 | Disposition: A | Discharge status: Home / Self Care | Payer: PPO | Source: Ambulatory Visit | Attending: Cardiology | Admitting: Cardiology

## 2015-05-15 ENCOUNTER — Inpatient Hospital Stay (HOSPITAL_COMMUNITY): Admission: RE | Admit: 2015-05-15 | Payer: PPO | Source: Ambulatory Visit

## 2015-05-15 ENCOUNTER — Encounter (HOSPITAL_COMMUNITY): Payer: Self-pay

## 2015-05-15 DIAGNOSIS — I2510 Atherosclerotic heart disease of native coronary artery without angina pectoris: Secondary | ICD-10-CM | POA: Diagnosis not present

## 2015-05-15 DIAGNOSIS — I259 Chronic ischemic heart disease, unspecified: Secondary | ICD-10-CM | POA: Insufficient documentation

## 2015-05-15 DIAGNOSIS — I252 Old myocardial infarction: Secondary | ICD-10-CM | POA: Insufficient documentation

## 2015-05-15 DIAGNOSIS — I255 Ischemic cardiomyopathy: Secondary | ICD-10-CM | POA: Diagnosis not present

## 2015-05-15 DIAGNOSIS — R0602 Shortness of breath: Secondary | ICD-10-CM

## 2015-05-15 LAB — NM MYOCAR MULTI W/SPECT W/WALL MOTION / EF
CHL CUP NUCLEAR SDS: 5
CHL CUP NUCLEAR SRS: 1
CHL CUP NUCLEAR SSS: 6
LHR: 0.35
LV sys vol: 60 mL
LVDIAVOL: 119 mL
Peak HR: 114 {beats}/min
Rest HR: 72 {beats}/min
TID: 0.99

## 2015-05-15 MED ORDER — TECHNETIUM TC 99M SESTAMIBI - CARDIOLITE
10.0000 | Freq: Once | INTRAVENOUS | Status: AC | PRN
  Administered 2015-05-15: 10.3 via INTRAVENOUS

## 2015-05-15 MED ORDER — SODIUM CHLORIDE 0.9 % IJ SOLN
INTRAMUSCULAR | Status: AC
  Filled 2015-05-15: qty 800

## 2015-05-15 MED ORDER — REGADENOSON 0.4 MG/5ML IV SOLN
INTRAVENOUS | Status: AC
  Administered 2015-05-15: 0.4 mg via INTRAVENOUS
  Filled 2015-05-15: qty 5

## 2015-05-15 MED ORDER — TECHNETIUM TC 99M SESTAMIBI GENERIC - CARDIOLITE
30.0000 | Freq: Once | INTRAVENOUS | Status: AC | PRN
  Administered 2015-05-15: 31.2 via INTRAVENOUS

## 2015-05-15 MED ORDER — SODIUM CHLORIDE 0.9 % IJ SOLN
INTRAMUSCULAR | Status: AC
  Administered 2015-05-15: 12:00:00 via INTRAVENOUS
  Filled 2015-05-15: qty 3

## 2015-05-19 ENCOUNTER — Encounter: Payer: Self-pay | Admitting: Cardiology

## 2015-05-19 ENCOUNTER — Ambulatory Visit (INDEPENDENT_AMBULATORY_CARE_PROVIDER_SITE_OTHER): Payer: PPO | Admitting: Cardiology

## 2015-05-19 VITALS — BP 150/92 | HR 81 | Ht 71.0 in | Wt 270.0 lb

## 2015-05-19 DIAGNOSIS — I10 Essential (primary) hypertension: Secondary | ICD-10-CM

## 2015-05-19 DIAGNOSIS — I25119 Atherosclerotic heart disease of native coronary artery with unspecified angina pectoris: Secondary | ICD-10-CM | POA: Diagnosis not present

## 2015-05-19 DIAGNOSIS — R9439 Abnormal result of other cardiovascular function study: Secondary | ICD-10-CM | POA: Diagnosis not present

## 2015-05-19 DIAGNOSIS — R0609 Other forms of dyspnea: Secondary | ICD-10-CM

## 2015-05-19 DIAGNOSIS — E782 Mixed hyperlipidemia: Secondary | ICD-10-CM
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
## 2015-05-19 NOTE — Patient Instructions (Signed)
Your physician has requested that you have a cardiac catheterization. Cardiac catheterization is used to diagnose and/or treat various heart conditions. Doctors may recommend this procedure for a number of different reasons. The most common reason is to evaluate chest pain. Chest pain can be a symptom of coronary artery disease (CAD), and cardiac catheterization can show whether plaque is narrowing or blocking your heart's arteries. This procedure is also used to evaluate the valves, as well as measure the blood flow and oxygen levels in different parts of your heart. For further information please visit [email or web address]. Please follow instruction sheet, as given.     See cath instruction sheet      Thank you for choosing [facility] !

## 2015-05-23 ENCOUNTER — Encounter (HOSPITAL_COMMUNITY): Payer: Self-pay | Admitting: Cardiovascular Disease

## 2015-05-23 ENCOUNTER — Ambulatory Visit (HOSPITAL_COMMUNITY)
Admission: RE | Admit: 2015-05-23 | Discharge: 2015-05-24 | Disposition: A | Discharge status: Home / Self Care | Payer: PPO | Source: Ambulatory Visit | Attending: Cardiovascular Disease | Admitting: Cardiovascular Disease

## 2015-05-23 ENCOUNTER — Encounter (HOSPITAL_COMMUNITY)
Admission: RE | Disposition: A | Discharge status: Home / Self Care | Payer: Self-pay | Source: Ambulatory Visit | Attending: Cardiovascular Disease

## 2015-05-23 DIAGNOSIS — Z6836 Body mass index (BMI) 36.0-36.9, adult: Secondary | ICD-10-CM | POA: Insufficient documentation

## 2015-05-23 DIAGNOSIS — Z794 Long term (current) use of insulin: Secondary | ICD-10-CM | POA: Diagnosis not present

## 2015-05-23 DIAGNOSIS — E119 Type 2 diabetes mellitus without complications: Secondary | ICD-10-CM | POA: Diagnosis not present

## 2015-05-23 DIAGNOSIS — G4733 Obstructive sleep apnea (adult) (pediatric): Secondary | ICD-10-CM | POA: Insufficient documentation

## 2015-05-23 DIAGNOSIS — Z7902 Long term (current) use of antithrombotics/antiplatelets: Secondary | ICD-10-CM | POA: Diagnosis not present

## 2015-05-23 DIAGNOSIS — I10 Essential (primary) hypertension: Secondary | ICD-10-CM | POA: Diagnosis present

## 2015-05-23 DIAGNOSIS — Z89511 Acquired absence of right leg below knee: Secondary | ICD-10-CM | POA: Diagnosis not present

## 2015-05-23 DIAGNOSIS — Z955 Presence of coronary angioplasty implant and graft: Secondary | ICD-10-CM | POA: Diagnosis not present

## 2015-05-23 DIAGNOSIS — Y831 Surgical operation with implant of artificial internal device as the cause of abnormal reaction of the patient, or of later complication, without mention of misadventure at the time of the procedure: Secondary | ICD-10-CM | POA: Insufficient documentation

## 2015-05-23 DIAGNOSIS — Z7982 Long term (current) use of aspirin: Secondary | ICD-10-CM | POA: Diagnosis not present

## 2015-05-23 DIAGNOSIS — I252 Old myocardial infarction: Secondary | ICD-10-CM | POA: Insufficient documentation

## 2015-05-23 DIAGNOSIS — Z79899 Other long term (current) drug therapy: Secondary | ICD-10-CM | POA: Diagnosis not present

## 2015-05-23 DIAGNOSIS — Z9861 Coronary angioplasty status: Secondary | ICD-10-CM

## 2015-05-23 DIAGNOSIS — T82855A Stenosis of coronary artery stent, initial encounter: Secondary | ICD-10-CM | POA: Diagnosis not present

## 2015-05-23 DIAGNOSIS — E782 Mixed hyperlipidemia: Secondary | ICD-10-CM | POA: Insufficient documentation

## 2015-05-23 DIAGNOSIS — I25110 Atherosclerotic heart disease of native coronary artery with unstable angina pectoris: Secondary | ICD-10-CM | POA: Diagnosis not present

## 2015-05-23 DIAGNOSIS — I200 Unstable angina: Secondary | ICD-10-CM

## 2015-05-23 DIAGNOSIS — Z87891 Personal history of nicotine dependence: Secondary | ICD-10-CM | POA: Insufficient documentation

## 2015-05-23 DIAGNOSIS — Z9989 Dependence on other enabling machines and devices: Secondary | ICD-10-CM

## 2015-05-23 DIAGNOSIS — R9439 Abnormal result of other cardiovascular function study: Secondary | ICD-10-CM

## 2015-05-23 DIAGNOSIS — I119 Hypertensive heart disease without heart failure: Secondary | ICD-10-CM | POA: Diagnosis not present

## 2015-05-23 DIAGNOSIS — I2510 Atherosclerotic heart disease of native coronary artery without angina pectoris: Secondary | ICD-10-CM | POA: Diagnosis present

## 2015-05-23 HISTORY — PX: BALLOON ANGIOPLASTY, ARTERY: SHX564

## 2015-05-23 HISTORY — PX: CARDIAC CATHETERIZATION: SHX172

## 2015-05-23 HISTORY — DX: Hypertensive heart disease without heart failure: I11.9

## 2015-05-23 LAB — BASIC METABOLIC PANEL
Anion gap: 12 (ref 5–15)
BUN: 25 mg/dL — AB (ref 6–20)
CHLORIDE: 100 mmol/L — AB (ref 101–111)
CO2: 28 mmol/L (ref 22–32)
CREATININE: 0.85 mg/dL (ref 0.61–1.24)
Calcium: 9.3 mg/dL (ref 8.9–10.3)
GFR calc non Af Amer: >60 mL/min (ref >60)
GLUCOSE: 227 mg/dL — AB (ref 65–99)
Potassium: 3.6 mmol/L (ref 3.5–5.1)
SODIUM: 140 mmol/L (ref 135–145)

## 2015-05-23 LAB — PROTIME-INR
INR: 1.11 (ref 0.00–1.49)
Prothrombin Time: 14.5 seconds (ref 11.6–15.2)

## 2015-05-23 LAB — GLUCOSE, CAPILLARY
GLUCOSE-CAPILLARY: 238 mg/dL — AB (ref 65–99)
GLUCOSE-CAPILLARY: 207 mg/dL — AB (ref 65–99)
Glucose-Capillary: 197 mg/dL — ABNORMAL HIGH (ref 65–99)
Glucose-Capillary: 224 mg/dL — ABNORMAL HIGH (ref 65–99)

## 2015-05-23 LAB — POCT ACTIVATED CLOTTING TIME
ACTIVATED CLOTTING TIME: 327 s
Activated Clotting Time: 229 seconds

## 2015-05-23 LAB — CBC
HEMATOCRIT: 40.9 % (ref 39.0–52.0)
Hemoglobin: 13.7 g/dL (ref 13.0–17.0)
MCH: 28.7 pg (ref 26.0–34.0)
MCHC: 33.5 g/dL (ref 30.0–36.0)
MCV: 85.6 fL (ref 78.0–100.0)
PLATELETS: 157 10*3/uL (ref 150–400)
RBC: 4.78 MIL/uL (ref 4.22–5.81)
RDW: 13.1 % (ref 11.5–15.5)
WBC: 5 10*3/uL (ref 4.0–10.5)

## 2015-05-23 SURGERY — LEFT HEART CATH AND CORONARY ANGIOGRAPHY

## 2015-05-23 MED ORDER — SODIUM CHLORIDE 0.9 % IV SOLN: 250.0000 mL | INTRAVENOUS | Status: DC | PRN

## 2015-05-23 MED ORDER — VERAPAMIL HCL 2.5 MG/ML IV SOLN
INTRAVENOUS | Status: AC
  Filled 2015-05-23: qty 2

## 2015-05-23 MED ORDER — FENTANYL CITRATE (PF) 100 MCG/2ML IJ SOLN
INTRAMUSCULAR | Status: AC
  Filled 2015-05-23: qty 2

## 2015-05-23 MED ORDER — LINAGLIPTIN 5 MG PO TABS
5.0000 mg | ORAL_TABLET | Freq: Every day | ORAL | Status: DC
  Administered 2015-05-24: 5 mg via ORAL
  Filled 2015-05-23: qty 1

## 2015-05-23 MED ORDER — SODIUM CHLORIDE 0.9 % IJ SOLN: 3.0000 mL | INTRAMUSCULAR | Status: DC | PRN

## 2015-05-23 MED ORDER — ONDANSETRON HCL 4 MG/2ML IJ SOLN: 4.0000 mg | Freq: Four times a day (QID) | INTRAMUSCULAR | Status: DC | PRN

## 2015-05-23 MED ORDER — FENOFIBRATE 160 MG PO TABS
160.0000 mg | ORAL_TABLET | Freq: Every day | ORAL | Status: DC
  Filled 2015-05-23: qty 1

## 2015-05-23 MED ORDER — SODIUM CHLORIDE 0.9 % IJ SOLN: 3.0000 mL | Freq: Two times a day (BID) | INTRAMUSCULAR | Status: DC

## 2015-05-23 MED ORDER — LIDOCAINE HCL (PF) 1 % IJ SOLN
INTRAMUSCULAR | Status: DC | PRN
  Administered 2015-05-23: 09:00:00

## 2015-05-23 MED ORDER — IOHEXOL 350 MG/ML SOLN
INTRAVENOUS | Status: DC | PRN
  Administered 2015-05-23: 130 mL via INTRACARDIAC

## 2015-05-23 MED ORDER — LIDOCAINE HCL (PF) 1 % IJ SOLN
INTRAMUSCULAR | Status: AC
  Filled 2015-05-23: qty 30

## 2015-05-23 MED ORDER — ACETAMINOPHEN 325 MG PO TABS: 650.0000 mg | ORAL_TABLET | ORAL | Status: DC | PRN

## 2015-05-23 MED ORDER — SODIUM CHLORIDE 0.9 % IV SOLN: INTRAVENOUS | Status: AC

## 2015-05-23 MED ORDER — HEPARIN SODIUM (PORCINE) 1000 UNIT/ML IJ SOLN
INTRAMUSCULAR | Status: AC
  Filled 2015-05-23: qty 1

## 2015-05-23 MED ORDER — MIDAZOLAM HCL 2 MG/2ML IJ SOLN
INTRAMUSCULAR | Status: AC
  Filled 2015-05-23: qty 2

## 2015-05-23 MED ORDER — FENTANYL CITRATE (PF) 100 MCG/2ML IJ SOLN
INTRAMUSCULAR | Status: DC | PRN
  Administered 2015-05-23 (×2): 25 ug via INTRAVENOUS

## 2015-05-23 MED ORDER — SODIUM CHLORIDE 0.9 % IV SOLN
INTRAVENOUS | Status: DC
  Administered 2015-05-23: 1000 mL via INTRAVENOUS

## 2015-05-23 MED ORDER — CLOPIDOGREL BISULFATE 300 MG PO TABS
ORAL_TABLET | ORAL | Status: AC
  Filled 2015-05-23: qty 1

## 2015-05-23 MED ORDER — NITROGLYCERIN 0.4 MG SL SUBL: 0.4000 mg | SUBLINGUAL_TABLET | SUBLINGUAL | Status: DC | PRN

## 2015-05-23 MED ORDER — ALPRAZOLAM 0.25 MG PO TABS: 0.5000 mg | ORAL_TABLET | Freq: Every evening | ORAL | Status: DC | PRN

## 2015-05-23 MED ORDER — VERAPAMIL HCL 2.5 MG/ML IV SOLN
INTRAVENOUS | Status: DC | PRN
  Administered 2015-05-23: 08:00:00 via INTRA_ARTERIAL

## 2015-05-23 MED ORDER — MIDAZOLAM HCL 2 MG/2ML IJ SOLN
INTRAMUSCULAR | Status: DC | PRN
  Administered 2015-05-23: 2 mg via INTRAVENOUS
  Administered 2015-05-23: 1 mg via INTRAVENOUS

## 2015-05-23 MED ORDER — ASPIRIN EC 81 MG PO TBEC
81.0000 mg | DELAYED_RELEASE_TABLET | Freq: Every day | ORAL | Status: DC
  Administered 2015-05-24: 81 mg via ORAL
  Filled 2015-05-23: qty 1

## 2015-05-23 MED ORDER — PANTOPRAZOLE SODIUM 40 MG PO TBEC
40.0000 mg | DELAYED_RELEASE_TABLET | Freq: Every day | ORAL | Status: DC
  Filled 2015-05-23: qty 1

## 2015-05-23 MED ORDER — HEPARIN SODIUM (PORCINE) 1000 UNIT/ML IJ SOLN
INTRAMUSCULAR | Status: DC | PRN
  Administered 2015-05-23: 3000 [IU] via INTRAVENOUS
  Administered 2015-05-23: 4000 [IU] via INTRAVENOUS
  Administered 2015-05-23: 6000 [IU] via INTRAVENOUS

## 2015-05-23 MED ORDER — HYDROCHLOROTHIAZIDE 25 MG PO TABS
25.0000 mg | ORAL_TABLET | Freq: Every day | ORAL | Status: DC
  Filled 2015-05-23: qty 1

## 2015-05-23 MED ORDER — ASPIRIN 81 MG PO CHEW: 81.0000 mg | CHEWABLE_TABLET | ORAL | Status: DC

## 2015-05-23 MED ORDER — CLOPIDOGREL BISULFATE 300 MG PO TABS
ORAL_TABLET | ORAL | Status: DC | PRN
  Administered 2015-05-23: 300 mg via ORAL

## 2015-05-23 MED ORDER — ROSUVASTATIN CALCIUM 5 MG PO TABS
5.0000 mg | ORAL_TABLET | Freq: Every day | ORAL | Status: DC
Start: 2015-05-24 — End: 2015-05-24
  Filled 2015-05-23: qty 1

## 2015-05-23 MED ORDER — HEPARIN (PORCINE) IN NACL 2-0.9 UNIT/ML-% IJ SOLN
INTRAMUSCULAR | Status: AC
  Filled 2015-05-23: qty 1000

## 2015-05-23 MED ORDER — LISINOPRIL-HYDROCHLOROTHIAZIDE 20-25 MG PO TABS: 1.0000 | ORAL_TABLET | Freq: Every day | ORAL | Status: DC

## 2015-05-23 MED ORDER — CLOPIDOGREL BISULFATE 75 MG PO TABS
75.0000 mg | ORAL_TABLET | Freq: Every day | ORAL | Status: DC
  Administered 2015-05-24: 75 mg via ORAL
  Filled 2015-05-23: qty 1

## 2015-05-23 MED ORDER — SODIUM CHLORIDE 0.9 % IJ SOLN
3.0000 mL | Freq: Two times a day (BID) | INTRAMUSCULAR | Status: DC
  Administered 2015-05-23: 3 mL via INTRAVENOUS

## 2015-05-23 MED ORDER — LISINOPRIL 10 MG PO TABS
20.0000 mg | ORAL_TABLET | Freq: Every day | ORAL | Status: DC
  Filled 2015-05-23: qty 2

## 2015-05-23 MED ORDER — INSULIN GLARGINE 100 UNIT/ML ~~LOC~~ SOLN
70.0000 [IU] | Freq: Two times a day (BID) | SUBCUTANEOUS | Status: DC
  Administered 2015-05-23: 23:00:00 70 [IU] via SUBCUTANEOUS
  Filled 2015-05-23 (×3): qty 0.7

## 2015-05-23 SURGICAL SUPPLY — 20 items
BALLN ANGIOSCULPT RX 3.0X10 (BALLOONS) ×3
BALLN ~~LOC~~ EUPHORA RX 3.0X12 (BALLOONS) ×3
BALLOON ANGIOSCULPT RX 3.0X10 (BALLOONS) IMPLANT
BALLOON ~~LOC~~ EUPHORA RX 3.0X12 (BALLOONS) IMPLANT
CATH INFINITI 5 FR JL3.5 (CATHETERS) ×3 IMPLANT
CATH INFINITI 5FR ANG PIGTAIL (CATHETERS) ×3 IMPLANT
CATH INFINITI JR4 5F (CATHETERS) ×3 IMPLANT
CUTTING BAL FLEXTOME RX 3.0X10 (BALLOONS) ×2 IMPLANT
DEVICE RAD COMP TR BAND LRG (VASCULAR PRODUCTS) ×3 IMPLANT
GLIDESHEATH SLEND SS 6F .021 (SHEATH) ×3 IMPLANT
GUIDE CATH RUNWAY 6FR AL 1 (CATHETERS) ×2 IMPLANT
KIT ENCORE 26 ADVANTAGE (KITS) ×2 IMPLANT
KIT HEART LEFT (KITS) ×3 IMPLANT
PACK CARDIAC CATHETERIZATION (CUSTOM PROCEDURE TRAY) ×3 IMPLANT
SYR MEDRAD MARK V 150ML (SYRINGE) ×3 IMPLANT
TRANSDUCER W/STOPCOCK (MISCELLANEOUS) ×3 IMPLANT
TUBING CIL FLEX 10 FLL-RA (TUBING) ×3 IMPLANT
WIRE COUGAR XT STRL 190CM (WIRE) ×2 IMPLANT
WIRE HI TORQ BMW 190CM (WIRE) ×2 IMPLANT
WIRE SAFE-T 1.5MM-J .035X260CM (WIRE) ×3 IMPLANT
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
## 2015-05-23 NOTE — Interval H&P Note (Signed)
History and Physical Interval Note:  05/23/2015 7:27 AM  Carl Mccoy  has presented today for cardiac cath with the diagnosis of unstable angina, CAD, abnormal nuclear stress test.  The various methods of treatment have been discussed with the patient and family. After consideration of risks, benefits and other options for treatment, the patient has consented to  Procedure(s): Left Heart Cath and Coronary Angiography (N/A) as a surgical intervention .  The patient's history has been reviewed, patient examined, no change in status, stable for surgery.  I have reviewed the patient's chart and labs.  Questions were answered to the patient's satisfaction.    Cath Lab Visit (complete for each Cath Lab visit)  Clinical Evaluation Leading to the Procedure:   ACS: No.  Non-ACS:    Anginal Classification: CCS III  Anti-ischemic medical therapy: No Therapy  Non-Invasive Test Results: Intermediate-risk stress test findings: cardiac mortality 1-3%/year  Prior CABG: No previous CABG         Luchiano Viscomi

## 2015-05-23 NOTE — Progress Notes (Signed)
TR BAND REMOVAL  LOCATION:    right radial  DEFLATED PER PROTOCOL:    Yes.    TIME BAND OFF / DRESSING APPLIED:    1300   SITE UPON ARRIVAL:    Level 0  SITE AFTER BAND REMOVAL:    Level 0  CIRCULATION SENSATION AND MOVEMENT:    Within Normal Limits   Yes.    COMMENTS:   Checked site at 1330 and frequently throughout remainder of shift with no change noted.

## 2015-05-24 ENCOUNTER — Encounter (HOSPITAL_COMMUNITY): Payer: Self-pay | Admitting: Nurse Practitioner

## 2015-05-24 DIAGNOSIS — E782 Mixed hyperlipidemia: Secondary | ICD-10-CM

## 2015-05-24 DIAGNOSIS — I10 Essential (primary) hypertension: Secondary | ICD-10-CM | POA: Diagnosis not present

## 2015-05-24 DIAGNOSIS — Z9989 Dependence on other enabling machines and devices: Secondary | ICD-10-CM

## 2015-05-24 DIAGNOSIS — E1159 Type 2 diabetes mellitus with other circulatory complications: Secondary | ICD-10-CM

## 2015-05-24 DIAGNOSIS — I25110 Atherosclerotic heart disease of native coronary artery with unstable angina pectoris: Secondary | ICD-10-CM | POA: Diagnosis not present

## 2015-05-24 DIAGNOSIS — E119 Type 2 diabetes mellitus without complications: Secondary | ICD-10-CM | POA: Diagnosis not present

## 2015-05-24 DIAGNOSIS — G4733 Obstructive sleep apnea (adult) (pediatric): Secondary | ICD-10-CM | POA: Diagnosis present

## 2015-05-24 DIAGNOSIS — E785 Hyperlipidemia, unspecified: Secondary | ICD-10-CM | POA: Insufficient documentation

## 2015-05-24 DIAGNOSIS — I2510 Atherosclerotic heart disease of native coronary artery without angina pectoris: Secondary | ICD-10-CM | POA: Diagnosis present

## 2015-05-24 DIAGNOSIS — Z794 Long term (current) use of insulin: Secondary | ICD-10-CM

## 2015-05-24 DIAGNOSIS — Z955 Presence of coronary angioplasty implant and graft: Secondary | ICD-10-CM | POA: Diagnosis not present

## 2015-05-24 DIAGNOSIS — I119 Hypertensive heart disease without heart failure: Secondary | ICD-10-CM | POA: Diagnosis present

## 2015-05-24 LAB — BASIC METABOLIC PANEL
Anion gap: 9 (ref 5–15)
BUN: 19 mg/dL (ref 6–20)
CO2: 25 mmol/L (ref 22–32)
Calcium: 8.9 mg/dL (ref 8.9–10.3)
Chloride: 102 mmol/L (ref 101–111)
Creatinine, Ser: 0.79 mg/dL (ref 0.61–1.24)
GFR calc Af Amer: >60 mL/min (ref >60)
GLUCOSE: 212 mg/dL — AB (ref 65–99)
POTASSIUM: 3.8 mmol/L (ref 3.5–5.1)
Sodium: 136 mmol/L (ref 135–145)

## 2015-05-24 LAB — GLUCOSE, CAPILLARY: Glucose-Capillary: 196 mg/dL — ABNORMAL HIGH (ref 65–99)

## 2015-05-24 LAB — CBC
HEMATOCRIT: 39.7 % (ref 39.0–52.0)
HEMOGLOBIN: 13.2 g/dL (ref 13.0–17.0)
MCH: 28.3 pg (ref 26.0–34.0)
MCHC: 33.2 g/dL (ref 30.0–36.0)
MCV: 85.2 fL (ref 78.0–100.0)
Platelets: 148 10*3/uL — ABNORMAL LOW (ref 150–400)
RBC: 4.66 MIL/uL (ref 4.22–5.81)
RDW: 13.1 % (ref 11.5–15.5)
WBC: 6 10*3/uL (ref 4.0–10.5)

## 2015-05-24 MED ORDER — METFORMIN HCL 500 MG PO TABS: 500.0000 mg | ORAL_TABLET | Freq: Two times a day (BID) | ORAL | Status: DC

## 2015-05-24 MED ORDER — CLOPIDOGREL BISULFATE 75 MG PO TABS: ORAL_TABLET | ORAL | Status: DC

## 2015-05-24 NOTE — Discharge Summary (Signed)
Discharge Summary    Patient ID: Carl Mccoy,  MRN: [id number], DOB/AGE: 1955/10/20 60 y.o.  Admit date: 05/23/2015 Discharge date: 05/24/2015  Primary Care Provider: Mauricio Mccoy Primary Cardiologist: Carl Gip, MD   Discharge Diagnoses    Principal Problem:   Unstable angina [facility])  **s/p PTCA to in-stent restenosis within the proximal RCA this admission.  Active Problems:   Abnormal myocardial perfusion study   CAD (coronary artery disease)   Essential hypertension, benign   Type 2 diabetes mellitus (HCC)   Hypertensive heart disease   Mixed hyperlipidemia   OSA on CPAP   Allergies No Known Allergies  Diagnostic Studies/Procedures    Cardiac Catheterization and Percutaneous Coronary Intervention 1.20.2017  Findings     Dominance: Right    Left Anterior Descending   . Ost LAD to Prox LAD lesion, 0% stenosed. Previously placed Ost LAD to Prox LAD drug eluting stent is patent.   . Mid LAD to Dist LAD lesion, 20% stenosed. Diffuse.   . First Diagonal Branch   The vessel is moderate in size.   Colon Flattery 1st Diag to 1st Diag lesion, 30% stenosed. Discrete.   . Second Diagonal Branch   The vessel is moderate in size.   . Third Diagonal Branch   The vessel is moderate in size.      Left Circumflex  . Vessel is moderate in size.   . Prox Cx to Mid Cx lesion, 30% stenosed. Diffuse.   . First Obtuse Marginal Branch   The vessel is moderate in size.   [name] Second Obtuse Marginal Branch   The vessel is moderate in size.      Right Coronary Artery   . Prox RCA-1 lesion, 10% stenosed. Diffuse. The lesion was previously treated with a drug-eluting stent one to two years ago.   . Prox RCA-2 lesion, 80% stenosed. Diffuse. The lesion was previously treated with a drug-eluting stent greater than two years ago.       **In-stent restenosis within the proximal RCA was successfully treated with balloon angioplasty.  The interventional team was unable to pass a stent into the  lesion area.    . Dist RCA-1 lesion, 10% stenosed. Diffuse. The lesion was previously treated with a drug-eluting stent greater than two years ago.   . Dist RCA-2 lesion, 30% stenosed. Discrete.   . Right Posterior Descending Artery   The vessel is moderate in size.   Colon Flattery RPDA lesion, 90% stenosed. Discrete.   . Right Posterior Atrioventricular Branch   . Post Atrio lesion, 50% stenosed. Discrete.     Left Ventricle The left ventricular size is normal. There is mild left ventricular systolic dysfunction. There are no wall motion abnormalities in the left ventricle. _____________   History of Present Illness     60 year old male with a prior history of coronary artery disease status post prior LAD and right coronary artery stenting in 2014 and 15. He recently developed recurrent dyspnea on exertion and underwent stress testing revealing an area of inferior scar with moderate peri-infarct ischemia. After being seen in clinic by Carl Mccoy, decision was made to pursue catheterization.  Hospital Course     Consultants: None   Patient presented to the [facility] cardiac catheterization laboratory on 05/23/2015. Diagnostic catheterization revealed severe in-stent restenosis within the previously placed proximal right coronary artery stent. The interventional team was unable to successfully pass a stent into this area. However, balloon angioplasty was performed and the lesion  was reduced from 80% to 20%. LV function was normal. Postprocedure, patient has been doing without recurrent symptoms or limitations. He will be discharged home today in good condition. _____________  Discharge Vitals Blood pressure 146/84, pulse 85, temperature 97.8 F (36.6 C), temperature source Oral, resp. rate 16, height 5\' 11"  (1.803 m), weight 263 lb 14.3 oz (119.7 kg), SpO2 97 %.  Filed Weights   05/23/15 0552 05/24/15 0511  Weight: 270 lb (122.471 kg) 263 lb 14.3 oz (119.7 kg)    Labs & Radiologic Studies      CBC  Recent Labs  05/23/15 0639 05/24/15 0551  WBC 5.0 6.0  HGB 13.7 13.2  HCT 40.9 39.7  MCV 85.6 85.2  PLT 157 [id number]*   Basic Metabolic Panel  Recent Labs  05/23/15 0639 05/24/15 0551  NA 140 136  K 3.6 3.8  CL 100* 102  CO2 28 25  GLUCOSE 227* 212*  BUN 25* 19  CREATININE 0.85 0.79  CALCIUM 9.3 8.9   Nm Myocar Multi W/spect W/wall Motion / Ef  05/15/2015   There was no ST segment deviation noted during stress.  Findings consistent with prior myocardial infarction with peri-infarct ischemia. There is a large inferior infarct with moderate peri-infarct ischemia.  This is an intermediate risk study.  Nuclear stress EF: 50%.     Disposition   Pt is being discharged home today in good condition.  Follow-up Plans & Appointments    Follow-up Information    Follow up with Carl Lesches, MD In 2 weeks.   Specialty:  Cardiology   Why:  we will arrange for follow-up and contact you.   Contact information:   [place] [id number] [phone]      Discharge Instructions    Amb Referral to Cardiac Rehabilitation    Complete by:  As directed   Diagnosis:  PCI Comment - not interested in attending . going to [facility]           Discharge Medications   Current Discharge Medication List    CONTINUE these medications which have CHANGED   Details  clopidogrel (PLAVIX) 75 MG tablet TAKE 1 TABLET BY MOUTH EVERY DAY WITH BREAKFAST Qty: 90 tablet, Refills: 3    metFORMIN (GLUCOPHAGE) 500 MG tablet Take 1 tablet (500 mg total) by mouth 2 (two) times daily. Qty: 180 tablet, Refills: 1      CONTINUE these medications which have NOT CHANGED   Details  ALPRAZolam (XANAX) 0.5 MG tablet TAKE 1 TABLET BY MOUTH EVERY NIGHT AT BEDTIME AS NEEDED FOR ANXIETY Qty: 60 tablet, Refills: 0    aspirin EC 81 MG tablet Take 81 mg by mouth daily.    CRESTOR 5 MG tablet TAKE 1 TABLET BY MOUTH EVERY DAY Qty: 30 tablet, Refills: 3    fenofibrate 160 MG tablet  Take 1 tablet (160 mg total) by mouth daily. Qty: 90 tablet, Refills: 1    insulin glargine (LANTUS) 100 UNIT/ML injection Inject 70 Units into the skin 2 (two) times daily.     lisinopril-hydrochlorothiazide (PRINZIDE,ZESTORETIC) 20-25 MG tablet Take 1 tablet by mouth daily. Qty: 90 tablet, Refills: 3    nitroGLYCERIN (NITROSTAT) 0.4 MG SL tablet Place 1 tablet (0.4 mg total) under the tongue every 5 (five) minutes as needed for chest pain. Qty: 25 tablet, Refills: 2    pantoprazole (PROTONIX) 40 MG tablet Take 1 tablet (40 mg total) by mouth daily. Qty: 90 tablet, Refills: 1    TRADJENTA 5  MG TABS tablet TAKE 1 TABLET BY MOUTH EVERY DAY Qty: 30 tablet, Refills: 0    Tetrahydrozoline HCl (VISINE OP) Place 2 drops into both eyes daily as needed (itching).          Outstanding Labs/Studies   None  Duration of Discharge Encounter   Greater than 30 minutes including physician time.  Signed, Murray Hodgkins NP 05/24/2015, 9:23 AM   I have examined the patient and reviewed assessment and plan and discussed with patient.  Agree with above as stated.  Right radial site stable without hematoma. Palpable pulse. Regular rate and rhythm. Clear lung sounds. He did well walking. He wants to go home. Continue aggressive risk factor medication including diabetes management, lipid-lowering therapy, regular exercise and weight loss. He wants to go back to work. He drives a dump truck. He will go back to work Wednesday and be mindful to avoid any strenuous activity with his right wrist for at least another few days after that.  Suesan Mohrmann S.

## 2015-05-24 NOTE — Care Management Note (Signed)
Case Management Note  Patient Details  Name: Carl Mccoy MRN: [id number] Date of Birth: 18-Oct-1955  Subjective/Objective:                  Unstable angina [facility]) Action/Plan: Discharge planning Expected Discharge Date:  05/24/15               Expected Discharge Plan:  Home/Self Care  In-House Referral:     Discharge planning Services  CM Consult  Post Acute Care Choice:    Choice offered to:     DME Arranged:    DME Agency:     HH Arranged:    [place] Agency:     Status of Service:  Completed, signed off  Medicare Important Message Given:    Date Medicare IM Given:    Medicare IM give by:    Date Additional Medicare IM Given:    Additional Medicare Important Message give by:     If discussed at [place] of Stay Meetings, dates discussed:    Additional Comments: CM met withpt and gave pt [employer] glucometer information (affordable and accurate).  Pt states "nevermind, I'll get one from my Doctor," which will be good if he can but as a medicare (not Medicaid) prescription for a meter will not be honored by a pharmacy nor does his insurance cover this as a prescription cost - will have to purchase over-the-counter unless his MD has sample.  No other CM needs were communicated. [name], RN 05/24/2015, 10:03 AM

## 2015-05-24 NOTE — Discharge Instructions (Signed)

## 2015-05-24 NOTE — Progress Notes (Signed)
[phone] Pt already walked this morning 600 ft without CP. A little SOB which is normal for him. We discussed watching carbs and trying to A1C down and heart healthy food choices. Gave diabetic and heart healthy diets. Reviewed NTG use. Discussed CRP 2 in [place] . Will refer but pt stated he will attend [employer] and ex on his own. Gave pt some instruction on how to get started as he stated he could do the bicycle but not walk far distances. Pt knows not to start Y until week is past due to wrist restrictions. Pt is wanting to lose weight which would help with A1C. Encouraged plavix use. Pt receptive to ed. Graylon Good RN BSN 05/24/2015 8:33 AM

## 2015-05-24 NOTE — Progress Notes (Signed)
Pt ambulated 678ft, with out assistance. No CP, mild SOB-states its always like that

## 2015-05-28 ENCOUNTER — Other Ambulatory Visit: Payer: Self-pay | Admitting: Family

## 2015-05-29 MED ORDER — ALPRAZOLAM 0.5 MG PO TABS: ORAL_TABLET | ORAL | Status: DC

## 2015-05-29 NOTE — Telephone Encounter (Signed)
Pt is calling in regards to her prescription for alprazolam and states pharmacy does not have it

## 2015-05-29 NOTE — Telephone Encounter (Signed)
Please advise 

## 2015-05-31 ENCOUNTER — Other Ambulatory Visit: Payer: Self-pay | Admitting: Family

## 2015-06-06 ENCOUNTER — Ambulatory Visit (INDEPENDENT_AMBULATORY_CARE_PROVIDER_SITE_OTHER): Payer: PPO | Admitting: Adult Health

## 2015-06-06 ENCOUNTER — Encounter: Payer: Self-pay | Admitting: Adult Health

## 2015-06-06 VITALS — BP 110/64 | HR 98 | Ht 71.0 in | Wt 267.0 lb

## 2015-06-06 DIAGNOSIS — I10 Essential (primary) hypertension: Secondary | ICD-10-CM | POA: Diagnosis not present

## 2015-06-06 DIAGNOSIS — I2510 Atherosclerotic heart disease of native coronary artery without angina pectoris: Secondary | ICD-10-CM

## 2015-06-06 MED ORDER — NITROGLYCERIN 0.4 MG SL SUBL: 0.4000 mg | SUBLINGUAL_TABLET | SUBLINGUAL | Status: DC | PRN

## 2015-06-06 NOTE — Patient Instructions (Signed)
Medication Instructions:  Your physician recommends that you continue on your current medications as directed. Please refer to the Current Medication list given to you today.   Labwork: NONE  Testing/Procedures: NONE  Follow-Up: Your physician wants you to follow-up in: 6 MONTHS WITH DR.   Domenic Polite You will receive a reminder letter in the mail two months in advance. If you don't receive a letter, please call our office to schedule the follow-up appointment.   Any Other Special Instructions Will Be Listed Below (If Applicable).   I SENT A REFILL IN FOR YOUR NITRO  If you need a refill on your cardiac medications before your next appointment, please call your pharmacy.

## 2015-06-06 NOTE — Progress Notes (Deleted)
Name: Carl Mccoy    DOB: 10/05/1955  Age: 60 y.o.  MR#: [id number]       PCP:  Mauricio Po, FNP      Insurance: Payor: Tenn[employer] Must / Plan: Tennis Must / Product Type: *No Product type* /   CC:   No chief complaint on file.   VS Filed Vitals:   06/06/15 1531  BP: 110/64  Pulse: 98  Height: 5\' 11"  (1.803 m)  Weight: 267 lb (121.11 kg)  SpO2: 97%    Weights Current Weight  06/06/15 267 lb (121.11 kg)  05/24/15 263 lb 14.3 oz (119.7 kg)  05/19/15 270 lb (122.471 kg)    Blood Pressure  BP Readings from Last 3 Encounters:  06/06/15 110/64  05/24/15 146/84  05/19/15 150/92     Admit date:  (Not on file) Last encounter with RMR:  Visit date not found   Allergy Review of patient's allergies indicates no known allergies.  Current Outpatient Prescriptions  Medication Sig Dispense Refill  . ALPRAZolam (XANAX) 0.5 MG tablet TAKE 1 TABLET BY MOUTH EVERY NIGHT AT BEDTIME AS NEEDED FOR ANXIETY 60 tablet 0  . aspirin EC 81 MG tablet Take 81 mg by mouth daily.    . clopidogrel (PLAVIX) 75 MG tablet TAKE 1 TABLET BY MOUTH EVERY DAY WITH BREAKFAST 90 tablet 3  . CRESTOR 5 MG tablet TAKE 1 TABLET BY MOUTH EVERY DAY 30 tablet 3  . fenofibrate 160 MG tablet Take 1 tablet (160 mg total) by mouth daily. 90 tablet 1  . insulin glargine (LANTUS) 100 UNIT/ML injection Inject 70 Units into the skin 2 (two) times daily.     [name] LANTUS SOLOSTAR 100 UNIT/ML Solostar Pen INJECT 90 UNITS SUBCUTANEOUS EVERY MORNING AND EVERY NIGHT AT BEDTIME 45 mL 0  . lisinopril-hydrochlorothiazide (PRINZIDE,ZESTORETIC) 20-25 MG tablet Take 1 tablet by mouth daily. 90 tablet 3  . metFORMIN (GLUCOPHAGE) 500 MG tablet Take 1 tablet (500 mg total) by mouth 2 (two) times daily. 180 tablet 1  . nitroGLYCERIN (NITROSTAT) 0.4 MG SL tablet Place 1 tablet (0.4 mg total) under the tongue every 5 (five) minutes as needed for chest pain. 25 tablet 2  . pantoprazole (PROTONIX) 40 MG tablet Take 1 tablet (40 mg  total) by mouth daily. 90 tablet 1  . Tetrahydrozoline HCl (VISINE OP) Place 2 drops into both eyes daily as needed (itching).    . TRADJENTA 5 MG TABS tablet TAKE 1 TABLET BY MOUTH EVERY DAY 30 tablet 5   No current facility-administered medications for this visit.    Discontinued Meds:   There are no discontinued medications.  Patient Active Problem List   Diagnosis Date Noted  . CAD (coronary artery disease)   . Hypertensive heart disease   . OSA on CPAP   . Hyperlipidemia   . Abnormal myocardial perfusion study   . Unstable angina ([place])   . Acute bronchitis 04/19/2015  . Shortness of breath 02/14/2015  . Morbid obesity ([place]) 02/14/2015  . Coronary atherosclerosis of native coronary artery 03/16/2013  . Mixed hyperlipidemia 02/02/2013  . Obstructive sleep apnea 02/02/2013  . Essential hypertension, benign 02/01/2013  . Type 2 diabetes mellitus ([place]) 02/01/2013    LABS    Component Value Date/Time   NA 136 05/24/2015 0551   NA 140 05/23/2015 0639   NA 139 02/14/2015 1415   K 3.8 05/24/2015 0551   K 3.6 05/23/2015 0639   K 3.9 02/14/2015 1415   CL 102 05/24/2015 0551  CL 100* 05/23/2015 0639   CL 101 02/14/2015 1415   CO2 25 05/24/2015 0551   CO2 28 05/23/2015 0639   CO2 30 02/14/2015 1415   GLUCOSE 212* 05/24/2015 0551   GLUCOSE 227* 05/23/2015 0639   GLUCOSE 258* 02/14/2015 1415   BUN 19 05/24/2015 0551   BUN 25* 05/23/2015 0639   BUN 22 02/14/2015 1415   CREATININE 0.79 05/24/2015 0551   CREATININE 0.85 05/23/2015 0639   CREATININE 0.97 02/14/2015 1415   CALCIUM 8.9 05/24/2015 0551   CALCIUM 9.3 05/23/2015 0639   CALCIUM 9.6 02/14/2015 1415   GFRNONAA >60 05/24/2015 0551   GFRNONAA >60 05/23/2015 0639   GFRNONAA >90 07/21/2013 0350   GFRAA >60 05/24/2015 0551   GFRAA >60 05/23/2015 0639   GFRAA >90 07/21/2013 0350   CMP     Component Value Date/Time   NA 136 05/24/2015 0551   K 3.8 05/24/2015 0551   CL 102 05/24/2015 0551   CO2 25 05/24/2015  0551   GLUCOSE 212* 05/24/2015 0551   BUN 19 05/24/2015 0551   CREATININE 0.79 05/24/2015 0551   CALCIUM 8.9 05/24/2015 0551   PROT 6.8 07/17/2013 1418   ALBUMIN 3.9 07/17/2013 1418   AST 22 07/17/2013 1418   ALT 22 07/17/2013 1418   ALKPHOS 64 07/17/2013 1418   BILITOT 0.4 07/17/2013 1418   GFRNONAA >60 05/24/2015 0551   GFRAA >60 05/24/2015 0551       Component Value Date/Time   WBC 6.0 05/24/2015 0551   WBC 5.0 05/23/2015 0639   WBC 4.5 07/21/2013 0350   HGB 13.2 05/24/2015 0551   HGB 13.7 05/23/2015 0639   HGB 12.1* 07/21/2013 0350   HCT 39.7 05/24/2015 0551   HCT 40.9 05/23/2015 0639   HCT 35.4* 07/21/2013 0350   MCV 85.2 05/24/2015 0551   MCV 85.6 05/23/2015 0639   MCV 87.4 07/21/2013 0350    Lipid Panel  No results found for: CHOL, TRIG, HDL, CHOLHDL, VLDL, LDLCALC, LDLDIRECT  ABG No results found for: PHART, PCO2ART, PO2ART, HCO3, TCO2, ACIDBASEDEF, O2SAT   No results found for: TSH BNP (last 3 results) No results for input(s): BNP in the last 8760 hours.  ProBNP (last 3 results)  Recent Labs  02/14/15 1415  PROBNP 33.0    Cardiac Panel (last 3 results) No results for input(s): CKTOTAL, CKMB, TROPONINI, RELINDX in the last 72 hours.  Iron/TIBC/Ferritin/ %Sat No results found for: IRON, TIBC, FERRITIN, IRONPCTSAT   EKG Orders placed or performed during the hospital encounter of 05/23/15  . EKG 12-Lead  . EKG 12-Lead  . EKG 12-Lead  . EKG 12-Lead  . EKG 12-Lead  . EKG 12-Lead     Prior Assessment and Plan Problem List as of 06/06/2015      Cardiovascular and Mediastinum   Essential hypertension, benign   Last Assessment & Plan 11/09/2013 Office Visit Written 11/09/2013  4:40 PM by Satira Sark, MD    Blood pressure is elevated. He just came to the office after work in the heat. States that he has been taking his medications. Weight loss and exercise would be beneficial. Keep follow up with Dr. Legrand Rams.      Coronary atherosclerosis of  native coronary artery   Last Assessment & Plan 04/19/2014 Office Visit Written 04/19/2014  4:31 PM by Satira Sark, MD    No active angina symptoms. Continues on aspirin, Plavix, Prinzide, and Crestor. No changes to current regimen. Follow-up in 6 months.  Unstable angina (HCC)   CAD (coronary artery disease)   Hypertensive heart disease     Respiratory   Obstructive sleep apnea   Last Assessment & Plan 03/14/2015 Office Visit Written 03/15/2015  6:24 AM by Tanda Rockers, MD    Although his snoring and AM HA better on present system his epworth is high and he has gained wt since the original titration so may need another titration or at least a review of a download, not just new hoses/ mask etc.  Will request maint check of present machine and download and if not adequate either an autoset with download or a formal repeat titration       Acute bronchitis   Last Assessment & Plan 04/19/2015 Office Visit Edited 04/19/2015  9:45 PM by Cassandria Anger, MD    Recurrent bronchospasm w/URIs  Zpac Depomedrol 80 mg IM Prom-cod syr Medrol pac      OSA on CPAP     Endocrine   Type 2 diabetes mellitus [facility])   Last Assessment & Plan 04/19/2015 Office Visit Written 04/19/2015  9:44 PM by Cassandria Anger, MD    Metformin, Lantus        Other   Mixed hyperlipidemia   Last Assessment & Plan 02/14/2015 Office Visit Written 02/14/2015  8:56 PM by Golden Circle, FNP    Refill Crestor per patient request. Obtain lipid profile when fasting.      Shortness of breath   Last Assessment & Plan 04/19/2015 Office Visit Written 04/19/2015 10:11 AM by Cassandria Anger, MD    Chronic Stress test, Pulm appt pending per pt      Morbid obesity [facility])   Last Assessment & Plan 03/14/2015 Office Visit Written 03/15/2015  6:26 AM by Tanda Rockers, MD    Complicated by aodm/ hbp/ osa   Body mass index is 37.9 kg/(m^2).  No results found for: TSH   Contributing to gerd  tendency/ doe/reviewed the need and the process to achieve and maintain neg calorie balance > defer f/u primary care including intermittently monitoring thyroid status        Abnormal myocardial perfusion study   Hyperlipidemia       Imaging: Nm Myocar Multi W/spect W/wall Motion / Ef  05/15/2015   There was no ST segment deviation noted during stress.  Findings consistent with prior myocardial infarction with peri-infarct ischemia. There is a large inferior infarct with moderate peri-infarct ischemia.  This is an intermediate risk study.  Nuclear stress EF: 50%.
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
## 2015-06-30 ENCOUNTER — Other Ambulatory Visit: Payer: Self-pay | Admitting: Family

## 2015-07-01 NOTE — Telephone Encounter (Signed)
Please advise, thanks.

## 2015-07-17 ENCOUNTER — Encounter: Payer: Self-pay | Admitting: Family

## 2015-07-17 ENCOUNTER — Other Ambulatory Visit (INDEPENDENT_AMBULATORY_CARE_PROVIDER_SITE_OTHER): Payer: PPO

## 2015-07-17 ENCOUNTER — Ambulatory Visit (INDEPENDENT_AMBULATORY_CARE_PROVIDER_SITE_OTHER): Payer: PPO | Admitting: Family

## 2015-07-17 VITALS — BP 124/70 | HR 73 | Temp 97.5°F | Resp 18 | Ht 71.0 in | Wt 267.0 lb

## 2015-07-17 DIAGNOSIS — F411 Generalized anxiety disorder: Secondary | ICD-10-CM | POA: Diagnosis not present

## 2015-07-17 DIAGNOSIS — Z794 Long term (current) use of insulin: Secondary | ICD-10-CM

## 2015-07-17 DIAGNOSIS — Z23 Encounter for immunization: Secondary | ICD-10-CM

## 2015-07-17 DIAGNOSIS — E1159 Type 2 diabetes mellitus with other circulatory complications: Secondary | ICD-10-CM | POA: Diagnosis not present

## 2015-07-17 LAB — BASIC METABOLIC PANEL
BUN: 18 mg/dL (ref 6–23)
CHLORIDE: 100 meq/L (ref 96–112)
CO2: 31 meq/L (ref 19–32)
Calcium: 9.6 mg/dL (ref 8.4–10.5)
Creatinine, Ser: 0.86 mg/dL (ref 0.40–1.50)
GFR: 96.32 mL/min (ref >60.00)
Glucose, Bld: 238 mg/dL — ABNORMAL HIGH (ref 70–99)
POTASSIUM: 3.8 meq/L (ref 3.5–5.1)
SODIUM: 138 meq/L (ref 135–145)

## 2015-07-17 LAB — HEMOGLOBIN A1C: HEMOGLOBIN A1C: 8 % — AB (ref 4.6–6.5)

## 2015-07-17 MED ORDER — ALPRAZOLAM 1 MG PO TABS: 1.0000 mg | ORAL_TABLET | Freq: Two times a day (BID) | ORAL | Status: DC | PRN
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
## 2015-07-17 NOTE — Assessment & Plan Note (Addendum)
Anxiety remains labile with current dosage of Xanax. Used for sleep and during the day for anxiety. Increase Xanax to 1 mg twice daily. Discussed possibility of starting an SNRI at next office visit if symptoms continue to be labile.

## 2015-07-17 NOTE — Progress Notes (Signed)
Pre visit review using our clinic review tool, if applicable. No additional management support is needed unless otherwise documented below in the visit note. 

## 2015-07-17 NOTE — Assessment & Plan Note (Signed)
Diabetes with previous A1c of 8.7. Obtain A1c and BMET. Continue current dosage of metformin, tradjenta, and Lantus. Maintained on lisinopril and Crestor for CAD risk reduction. Diabetic eye exam is up to date. Diabetic foot exam completed today. Pneumovax updated today. Follow up in 3 months pending A1c results.

## 2015-07-17 NOTE — Patient Instructions (Addendum)
Thank you for choosing [facility].  Summary/Instructions:  Your prescription(s) have been submitted to your pharmacy or been printed and provided for you. Please take as directed and contact our office if you believe you are having problem(s) with the medication(s) or have any questions.  Please stop by the lab on the basement level of the building for your blood work. Your results will be released to MyChart (or called to you) after review, usually within 72 hours after test completion. If any changes need to be made, you will be notified at that same time.  Please continue to take your medications as prescribed.    

## 2015-07-20 ENCOUNTER — Encounter: Payer: Self-pay | Admitting: Family

## 2015-07-21 ENCOUNTER — Other Ambulatory Visit: Payer: Self-pay | Admitting: Family

## 2015-07-22 ENCOUNTER — Other Ambulatory Visit: Payer: Self-pay | Admitting: Family

## 2015-07-22 MED ORDER — INSULIN GLARGINE 100 UNIT/ML SOLOSTAR PEN: PEN_INJECTOR | SUBCUTANEOUS | Status: DC

## 2015-07-22 NOTE — Addendum Note (Signed)
Addended by: Earnstine Regal on: 07/22/2015 12:08 PM   Modules accepted: Orders

## 2015-07-28 ENCOUNTER — Other Ambulatory Visit: Payer: Self-pay | Admitting: Family

## 2015-08-18 ENCOUNTER — Other Ambulatory Visit: Payer: Self-pay | Admitting: Family

## 2015-08-19 MED ORDER — ALPRAZOLAM 1 MG PO TABS: 1.0000 mg | ORAL_TABLET | Freq: Two times a day (BID) | ORAL | Status: DC | PRN

## 2015-08-19 NOTE — Addendum Note (Signed)
Addended by: Mauricio Po D on: 08/19/2015 05:26 PM   Modules accepted: Orders

## 2015-08-27 ENCOUNTER — Other Ambulatory Visit: Payer: Self-pay | Admitting: Family

## 2015-09-19 ENCOUNTER — Telehealth: Payer: Self-pay | Admitting: *Deleted

## 2015-09-19 MED ORDER — ALPRAZOLAM 1 MG PO TABS: 1.0000 mg | ORAL_TABLET | Freq: Two times a day (BID) | ORAL | Status: DC | PRN

## 2015-09-19 NOTE — Telephone Encounter (Signed)
Requesting refill on pt alprazolam../lmb

## 2015-09-19 NOTE — Telephone Encounter (Signed)
Medication refilled

## 2015-09-19 NOTE — Telephone Encounter (Signed)
FAXED SCRIPT TO [facility]...[name]

## 2015-09-25 ENCOUNTER — Other Ambulatory Visit: Payer: Self-pay | Admitting: Family

## 2015-10-03 ENCOUNTER — Ambulatory Visit (INDEPENDENT_AMBULATORY_CARE_PROVIDER_SITE_OTHER): Payer: PPO | Admitting: Cardiology

## 2015-10-03 ENCOUNTER — Encounter: Payer: Self-pay | Admitting: Cardiology

## 2015-10-03 VITALS — BP 138/82 | HR 90 | Ht 71.0 in | Wt 268.0 lb

## 2015-10-03 DIAGNOSIS — I25119 Atherosclerotic heart disease of native coronary artery with unspecified angina pectoris: Secondary | ICD-10-CM

## 2015-10-03 DIAGNOSIS — E1159 Type 2 diabetes mellitus with other circulatory complications: Secondary | ICD-10-CM

## 2015-10-03 DIAGNOSIS — E782 Mixed hyperlipidemia: Secondary | ICD-10-CM
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
## 2015-10-03 NOTE — Patient Instructions (Signed)
Your physician wants you to follow-up in: 6 months You will receive a reminder letter in the mail two months in advance. If you don't receive a letter, please call our office to schedule the follow-up appointment.    Your physician recommends that you continue on your current medications as directed. Please refer to the Current Medication list given to you today.    If you need a refill on your cardiac medications before your next appointment, please call your pharmacy.     Thank you for choosing [facility] !        

## 2015-10-10 ENCOUNTER — Ambulatory Visit: Payer: PPO | Admitting: Pulmonary Disease

## 2015-10-27 ENCOUNTER — Ambulatory Visit (INDEPENDENT_AMBULATORY_CARE_PROVIDER_SITE_OTHER): Payer: PPO | Admitting: Internal Medicine

## 2015-10-27 ENCOUNTER — Encounter: Payer: Self-pay | Admitting: Internal Medicine

## 2015-10-27 VITALS — BP 110/72 | HR 88 | Temp 98.0°F | Resp 18 | Wt 256.0 lb

## 2015-10-27 DIAGNOSIS — A059 Bacterial foodborne intoxication, unspecified: Secondary | ICD-10-CM | POA: Diagnosis not present

## 2015-10-27 MED ORDER — PROMETHAZINE HCL 25 MG PO TABS: 25.0000 mg | ORAL_TABLET | Freq: Three times a day (TID) | ORAL | Status: DC | PRN

## 2015-10-27 MED ORDER — CIPROFLOXACIN HCL 500 MG PO TABS: 500.0000 mg | ORAL_TABLET | Freq: Two times a day (BID) | ORAL | Status: DC

## 2015-10-27 MED ORDER — ALPRAZOLAM 1 MG PO TABS: 1.0000 mg | ORAL_TABLET | Freq: Two times a day (BID) | ORAL | Status: DC | PRN
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
## 2015-10-27 NOTE — Progress Notes (Signed)
Pre visit review using our clinic review tool, if applicable. No additional management support is needed unless otherwise documented below in the visit note. 

## 2015-10-27 NOTE — Assessment & Plan Note (Signed)
Likely food poisoning Will treat with cipro 500 mg BID x 3 days Phenergan as needed for nausea Stressed fluids If no improvement will order stool cultures, blood work, but most likely he should have improvement in the next 48 hrs Note given for work Call if no improvement

## 2015-10-27 NOTE — Patient Instructions (Signed)
An antibiotic and anti-nausea medication was sent to your pharmacy.  Take as directed and call with any questions.  Drink as many fluids as possible.    Food Poisoning Food poisoning is an illness caused by something you ate or drank. There are over 250 known causes of food poisoning. However, many other causes are unknown.You can be treated even if the exact cause of your food poisoning is not known. In most cases, food poisoning is mild and lasts 1 to 2 days. However, some cases can be serious, especially for people with low immune systems, the elderly, children and infants, and pregnant women. CAUSES  Poor personal hygiene, improper cleaning of storage and preparation areas, and unclean utensils can cause infection or tainting (contamination) of foods. The causes of food poisoning are numerous.Infectious agents, such as viruses, bacteria, or parasites, can cause harm by infecting the intestine and disrupting the absorption of nutrients and water. This can cause diarrhea and lead to dehydration. Viruses are responsible for most of the food poisonings in which an agent is found. Parasites are less likely to cause food poisoning. Toxic agents, such as poisonous mushrooms, marine algae, and pesticides can also cause food poisoning.  Viral causes of food poisoning include:  Norovirus.  Rotavirus.  Hepatitis A.  Bacterial causes of food poisoning include:  Salmonellae.  Campylobacter.  Bacillus cereus.  Escherichia coli (E. coli).  Shigella.  Listeria monocytogenes.  Clostridium botulinum (botulism).  Vibrio cholerae.  Parasites that can cause food poisoning include:  Giardia.  Cryptosporidium.  Toxoplasma. SYMPTOMS Symptoms may appear several hours or longer after consuming the contaminated food or drink. Symptoms may include:  Nausea.  Vomiting.  Cramping.  Diarrhea.  Fever and chills.  Muscle aches. DIAGNOSIS Your health care provider may be able to diagnose  food poisoning from a list of what you have recently eaten and results from lab tests. Diagnostic tests may include an exam of the feces. TREATMENT In most cases, treatment focuses on helping to relieve your symptoms and staying well hydrated. Antibiotic medicines are rarely needed. In severe cases, hospitalization may be required. HOME CARE INSTRUCTIONS   Drink enough water and fluids to keep your urine clear or pale yellow. Drink small amounts of fluids frequently and increase as tolerated.  Ask your health care provider for specific rehydration instructions.  Avoid:  Foods high in sugar.  Alcohol.  Carbonated drinks.  Tobacco.  Juice.  Caffeine drinks.  Extremely hot or cold fluids.  Fatty, greasy foods.  Too much intake of anything at one time.  Dairy products until 24 to 48 hours after diarrhea stops.  You may consume probiotics. Probiotics are active cultures of beneficial bacteria. They may lessen the amount and number of diarrheal stools in adults. Probiotics can be found in yogurt with active cultures and in supplements.  Wash your hands well to avoid spreading the bacteria.  Take medicines only as directed by your health care provider. Do not give your child aspirin because of the association with Reye's syndrome.  Ask your health care provider if you should continue to take your regular prescribed and over-the-counter medicines. PREVENTION   Wash your hands, food preparation surfaces, and utensils thoroughly before and after handling raw foods.  Keep refrigerated foods below 18F (5C).  Serve hot foods immediately or keep them heated above 118F (60C).  Divide large volumes of food into small portions for rapid cooling in the refrigerator. Hot, bulky foods in the refrigerator can raise the temperature of other  foods that have already cooled.  Follow approved canning procedures.  Heat canned foods thoroughly before tasting.  When in doubt, throw it  out.  Infants, the elderly, women who are pregnant, and people with compromised immune systems are especially susceptible to food poisoning. These people should never consume unpasteurized cheese, unpasteurized cider, raw fish, raw seafood, or raw meat-type products. SEEK IMMEDIATE MEDICAL CARE IF:   You have difficulty breathing, swallowing, talking, or moving.  You develop blurred vision.  You are unable to keep fluids down.  You faint or nearly faint.  Your eyes turn yellow.  Vomiting or diarrhea develops or becomes persistent.  Abdominal pain develops, increases, or localizes in one small area.  You have a fever.  The diarrhea becomes excessive or contains blood or mucus.  You develop excessive weakness, dizziness, or extreme thirst.  You have no urine for 8 hours. MAKE SURE YOU:   Understand these instructions.  Will watch your condition.  Will get help right away if you are not doing well or get worse.   This information is not intended to replace advice given to you by your health care provider. Make sure you discuss any questions you have with your health care provider.   Document Released: 01/16/2004 Document Revised: 05/10/2014 Document Reviewed: 10/21/2014 Elsevier Interactive Patient Education [employer].

## 2015-10-30 ENCOUNTER — Encounter: Payer: Self-pay | Admitting: Family

## 2015-10-30 ENCOUNTER — Telehealth: Payer: Self-pay

## 2015-10-30 MED ORDER — ONDANSETRON 4 MG PO TBDP: 4.0000 mg | ORAL_TABLET | Freq: Three times a day (TID) | ORAL | Status: DC | PRN

## 2015-10-30 NOTE — Telephone Encounter (Signed)
PA initiated via CoverMyMeds key [id number]

## 2015-11-07 NOTE — Telephone Encounter (Signed)
DENIED - Diagnosis is not listed as an FDA-approved diagnosis or a medically accepted indication recognized by a Medicare-approved reference

## 2015-11-11 NOTE — Telephone Encounter (Signed)
Does he continue to have symptoms? If so we can try to send in promethazine, although his symptoms should have well improved by this time.

## 2015-11-12 NOTE — Telephone Encounter (Signed)
Pt is feeling a lot better. States he does not need the promethazine. Will call back if he has anymore issues.

## 2015-11-24 ENCOUNTER — Other Ambulatory Visit: Payer: Self-pay | Admitting: Cardiology

## 2015-12-02 ENCOUNTER — Other Ambulatory Visit: Payer: Self-pay | Admitting: Family

## 2015-12-04 ENCOUNTER — Other Ambulatory Visit: Payer: Self-pay | Admitting: Family

## 2015-12-26 ENCOUNTER — Encounter: Payer: Self-pay | Admitting: Family

## 2015-12-26 ENCOUNTER — Ambulatory Visit (INDEPENDENT_AMBULATORY_CARE_PROVIDER_SITE_OTHER): Payer: PPO | Admitting: Family

## 2015-12-26 ENCOUNTER — Other Ambulatory Visit (INDEPENDENT_AMBULATORY_CARE_PROVIDER_SITE_OTHER): Payer: PPO

## 2015-12-26 VITALS — BP 122/74 | HR 72 | Temp 97.7°F | Resp 16 | Ht 71.0 in | Wt 264.0 lb

## 2015-12-26 DIAGNOSIS — Z23 Encounter for immunization: Secondary | ICD-10-CM | POA: Diagnosis not present

## 2015-12-26 DIAGNOSIS — E1159 Type 2 diabetes mellitus with other circulatory complications: Secondary | ICD-10-CM

## 2015-12-26 DIAGNOSIS — I10 Essential (primary) hypertension: Secondary | ICD-10-CM

## 2015-12-26 DIAGNOSIS — I25110 Atherosclerotic heart disease of native coronary artery with unstable angina pectoris: Secondary | ICD-10-CM

## 2015-12-26 DIAGNOSIS — E669 Obesity, unspecified: Secondary | ICD-10-CM

## 2015-12-26 DIAGNOSIS — G4733 Obstructive sleep apnea (adult) (pediatric): Secondary | ICD-10-CM | POA: Diagnosis not present

## 2015-12-26 DIAGNOSIS — Z0189 Encounter for other specified special examinations: Secondary | ICD-10-CM

## 2015-12-26 DIAGNOSIS — Z7289 Other problems related to lifestyle: Secondary | ICD-10-CM

## 2015-12-26 DIAGNOSIS — F411 Generalized anxiety disorder: Secondary | ICD-10-CM

## 2015-12-26 DIAGNOSIS — Z794 Long term (current) use of insulin: Secondary | ICD-10-CM

## 2015-12-26 DIAGNOSIS — E782 Mixed hyperlipidemia: Secondary | ICD-10-CM

## 2015-12-26 DIAGNOSIS — Z9989 Dependence on other enabling machines and devices: Secondary | ICD-10-CM

## 2015-12-26 DIAGNOSIS — Z0000 Encounter for general adult medical examination without abnormal findings: Secondary | ICD-10-CM | POA: Diagnosis not present

## 2015-12-26 LAB — COMPREHENSIVE METABOLIC PANEL
ALBUMIN: 4.1 g/dL (ref 3.5–5.2)
ALK PHOS: 57 U/L (ref 39–117)
ALT: 16 U/L (ref 0–53)
AST: 16 U/L (ref 0–37)
BUN: 27 mg/dL — AB (ref 6–23)
CO2: 29 mEq/L (ref 19–32)
Calcium: 8.9 mg/dL (ref 8.4–10.5)
Chloride: 101 mEq/L (ref 96–112)
Creatinine, Ser: 0.95 mg/dL (ref 0.40–1.50)
GFR: 85.74 mL/min (ref >60.00)
Glucose, Bld: 248 mg/dL — ABNORMAL HIGH (ref 70–99)
POTASSIUM: 3.8 meq/L (ref 3.5–5.1)
SODIUM: 139 meq/L (ref 135–145)
TOTAL PROTEIN: 6.6 g/dL (ref 6.0–8.3)
Total Bilirubin: 0.3 mg/dL (ref 0.2–1.2)

## 2015-12-26 LAB — HEMOGLOBIN A1C: HEMOGLOBIN A1C: 9.2 % — AB (ref 4.6–6.5)

## 2015-12-26 MED ORDER — ALPRAZOLAM 1 MG PO TABS: 1.0000 mg | ORAL_TABLET | Freq: Two times a day (BID) | ORAL | 1 refills | Status: DC | PRN

## 2015-12-26 MED ORDER — LINAGLIPTIN 5 MG PO TABS: 5.0000 mg | ORAL_TABLET | Freq: Every day | ORAL | 1 refills | Status: DC

## 2015-12-26 MED ORDER — GLUCOSE BLOOD VI STRP: ORAL_STRIP | 12 refills | Status: DC

## 2015-12-26 MED ORDER — INSULIN GLARGINE 100 UNIT/ML SOLOSTAR PEN: PEN_INJECTOR | SUBCUTANEOUS | 1 refills | Status: DC

## 2015-12-26 MED ORDER — METFORMIN HCL 500 MG PO TABS: 500.0000 mg | ORAL_TABLET | Freq: Two times a day (BID) | ORAL | 1 refills | Status: DC

## 2015-12-26 MED ORDER — FENOFIBRATE 160 MG PO TABS: ORAL_TABLET | ORAL | 1 refills | Status: DC

## 2015-12-26 MED ORDER — LISINOPRIL-HYDROCHLOROTHIAZIDE 20-25 MG PO TABS: 1.0000 | ORAL_TABLET | Freq: Every day | ORAL | 3 refills | Status: DC

## 2015-12-26 MED ORDER — PANTOPRAZOLE SODIUM 40 MG PO TBEC: 40.0000 mg | DELAYED_RELEASE_TABLET | Freq: Every day | ORAL | 1 refills | Status: DC

## 2015-12-26 MED ORDER — ROSUVASTATIN CALCIUM 5 MG PO TABS: 5.0000 mg | ORAL_TABLET | Freq: Every day | ORAL | 1 refills | Status: DC

## 2015-12-26 MED ORDER — ONETOUCH SURESOFT LANCING DEV MISC: 1 refills | Status: DC

## 2015-12-26 MED ORDER — ONETOUCH ULTRA MINI W/DEVICE KIT: PACK | 0 refills | Status: DC

## 2015-12-26 NOTE — Assessment & Plan Note (Signed)
Hypertension is well-controlled below goal 140/90 with current regimen and no adverse side effects. Denies worse headache of life and no symptoms of end organ damage. Continue current dosage of lisinopril-hydrochlorothiazide. Encouraged to monitor blood pressure at home and follow low-sodium diet.

## 2015-12-26 NOTE — Assessment & Plan Note (Signed)
Hyperlipidemia and due for lipid profile at next office visit. No adverse side effects or myalgias. Continue current dosage of rosuvastatin.
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
## 2015-12-26 NOTE — Assessment & Plan Note (Signed)
BMI of 36.82. Not currently following a nutritionally sound intake. Recommend weight loss of 5-10% of current body weight. Recommend increasing physical activity to 30 minutes of moderate level activity daily. Encourage nutritional intake that focuses on nutrient dense foods and is moderate, varied, and balanced and is low in saturated fats and processed/sugary foods. Continue to monitor.

## 2015-12-26 NOTE — Assessment & Plan Note (Signed)
Monitored through risk factor evaluation and modification including hyperlipidemia, type 2 diabetes, and hypertension. No chest pain currently. Continue to monitor.

## 2015-12-26 NOTE — Assessment & Plan Note (Addendum)
Obtain A1c. Pneumovax, ophthalmology exam, and foot exam are all up-to-date. Maintained on lisinopril and simvastatin for CAD risk reduction. Continue current dosage of metformin, Lantus, and try gentle pending A1c results. Encouraged to follow a low carbohydrate diet and reduce intake of saturated fats, processed/sugary foods, and trans fat. Encouraged to monitor blood sugars at home. Work on weight loss to help with blood sugar control.

## 2015-12-26 NOTE — Patient Instructions (Addendum)
Thank you for choosing [facility].  SUMMARY AND INSTRUCTIONS:   Check out [email or web address]  Medication:  Please continue to take your medication as prescribed.   Your prescription(s) have been submitted to your pharmacy or been printed and provided for you. Please take as directed and contact our office if you believe you are having problem(s) with the medication(s) or have any questions.  Labs:  Please stop by the lab on the lower level of the building for your blood work. Your results will be released to [place] (or called to you) after review, usually within 72 hours after test completion. If any changes need to be made, you will be notified at that same time.  1.) The lab is open from 7:30am to 5:30 pm Monday-Friday 2.) No appointment is necessary 3.) Fasting (if needed) is 6-8 hours after food and drink; black coffee and water are okay   Follow up:  If your symptoms worsen or fail to improve, please contact our office for further instruction, or in case of emergency go directly to the emergency room at the closest medical facility.

## 2015-12-26 NOTE — Assessment & Plan Note (Signed)
Generalized anxiety disorder well controlled with current regimen and no adverse side effects. Reports taking medication as needed. Continue current dosage of alprazolam. [place] controlled substance database reviewed with no irregularities.

## 2015-12-26 NOTE — Assessment & Plan Note (Signed)
Stable and currently maintained on CPAP with no adverse side effects. Reports sleeping well and feeling well rested when he wakes up in the morning. Continue current dosage of CPAP with changes per pulmonology as needed.

## 2015-12-27 LAB — HIV ANTIBODY (ROUTINE TESTING W REFLEX): HIV: NONREACTIVE

## 2015-12-27 LAB — HEPATITIS C ANTIBODY: HCV AB: NEGATIVE

## 2015-12-29 ENCOUNTER — Encounter: Payer: Self-pay | Admitting: Family

## 2015-12-30 ENCOUNTER — Other Ambulatory Visit: Payer: Self-pay | Admitting: Family

## 2016-01-26 ENCOUNTER — Telehealth: Payer: Self-pay | Admitting: *Deleted

## 2016-01-26 DIAGNOSIS — F411 Generalized anxiety disorder: Secondary | ICD-10-CM

## 2016-01-26 MED ORDER — ALPRAZOLAM 1 MG PO TABS: 1.0000 mg | ORAL_TABLET | Freq: Two times a day (BID) | ORAL | 1 refills | Status: DC | PRN

## 2016-01-26 NOTE — Telephone Encounter (Signed)
Left msg on triage requesting refill on pt Alprazolam.../lmb

## 2016-01-26 NOTE — Telephone Encounter (Signed)
Medication refilled

## 2016-01-26 NOTE — Telephone Encounter (Signed)
Notified pt rx faxed to [facility].../lmb 

## 2016-03-26 ENCOUNTER — Other Ambulatory Visit: Payer: Self-pay | Admitting: Family

## 2016-03-26 DIAGNOSIS — F411 Generalized anxiety disorder: Secondary | ICD-10-CM

## 2016-03-29 NOTE — Telephone Encounter (Signed)
Last refill was 01/26/16
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
## 2016-04-14 ENCOUNTER — Ambulatory Visit (INDEPENDENT_AMBULATORY_CARE_PROVIDER_SITE_OTHER): Payer: PPO | Admitting: Cardiology

## 2016-04-14 ENCOUNTER — Ambulatory Visit (HOSPITAL_COMMUNITY)
Admission: RE | Admit: 2016-04-14 | Discharge: 2016-04-14 | Disposition: A | Discharge status: Home / Self Care | Payer: PPO | Source: Ambulatory Visit | Attending: Cardiology | Admitting: Cardiology

## 2016-04-14 ENCOUNTER — Other Ambulatory Visit: Payer: Self-pay | Admitting: Cardiology

## 2016-04-14 ENCOUNTER — Encounter: Payer: Self-pay | Admitting: Cardiology

## 2016-04-14 VITALS — BP 144/82 | HR 79 | Ht 71.0 in | Wt 265.0 lb

## 2016-04-14 DIAGNOSIS — I10 Essential (primary) hypertension: Secondary | ICD-10-CM

## 2016-04-14 DIAGNOSIS — E782 Mixed hyperlipidemia: Secondary | ICD-10-CM

## 2016-04-14 DIAGNOSIS — I200 Unstable angina: Secondary | ICD-10-CM | POA: Diagnosis not present

## 2016-04-14 DIAGNOSIS — Z01818 Encounter for other preprocedural examination: Secondary | ICD-10-CM

## 2016-04-14 DIAGNOSIS — I25119 Atherosclerotic heart disease of native coronary artery with unspecified angina pectoris: Secondary | ICD-10-CM

## 2016-04-14 NOTE — Patient Instructions (Signed)
Medication Instructions:  Your physician recommends that you continue on your current medications as directed. Please refer to the Current Medication list given to you today.   Labwork: Your physician recommends that you return for lab work in: TODAY    Testing/Procedures: A chest x-ray takes a picture of the organs and structures inside the chest, including the heart, lungs, and blood vessels. This test can show several things, including, whether the heart is enlarges; whether fluid is building up in the lungs; and whether pacemaker / defibrillator leads are still in place.  Your physician has requested that you have a cardiac catheterization. Cardiac catheterization is used to diagnose and/or treat various heart conditions. Doctors may recommend this procedure for a number of different reasons. The most common reason is to evaluate chest pain. Chest pain can be a symptom of coronary artery disease (CAD), and cardiac catheterization can show whether plaque is narrowing or blocking your heart's arteries. This procedure is also used to evaluate the valves, as well as measure the blood flow and oxygen levels in different parts of your heart. For further information please visit [email or web address]. Please follow instruction sheet, as given.   Follow-Up: Your physician recommends that you schedule a follow-up appointment in: AFTER HEART CATH    Any Other Special Instructions Will Be Listed Below (If Applicable).     If you need a refill on your cardiac medications before your next appointment, please call your pharmacy.

## 2016-04-22 ENCOUNTER — Encounter: Payer: Self-pay | Admitting: Family

## 2016-04-22 ENCOUNTER — Ambulatory Visit (INDEPENDENT_AMBULATORY_CARE_PROVIDER_SITE_OTHER): Payer: PPO | Admitting: Family

## 2016-04-22 VITALS — BP 158/98 | HR 128 | Temp 97.8°F | Resp 20 | Ht 71.0 in | Wt 266.0 lb

## 2016-04-22 DIAGNOSIS — F411 Generalized anxiety disorder: Secondary | ICD-10-CM | POA: Diagnosis not present

## 2016-04-22 DIAGNOSIS — I10 Essential (primary) hypertension: Secondary | ICD-10-CM | POA: Diagnosis not present

## 2016-04-22 DIAGNOSIS — Z9989 Dependence on other enabling machines and devices: Secondary | ICD-10-CM

## 2016-04-22 DIAGNOSIS — G4733 Obstructive sleep apnea (adult) (pediatric): Secondary | ICD-10-CM

## 2016-04-22 DIAGNOSIS — Z794 Long term (current) use of insulin: Secondary | ICD-10-CM | POA: Diagnosis not present

## 2016-04-22 DIAGNOSIS — E669 Obesity, unspecified: Secondary | ICD-10-CM

## 2016-04-22 DIAGNOSIS — E1159 Type 2 diabetes mellitus with other circulatory complications: Secondary | ICD-10-CM

## 2016-04-22 DIAGNOSIS — E1310 Other specified diabetes mellitus with ketoacidosis without coma: Secondary | ICD-10-CM

## 2016-04-22 DIAGNOSIS — E1110 Type 2 diabetes mellitus with ketoacidosis without coma: Secondary | ICD-10-CM

## 2016-04-22 LAB — POCT GLYCOSYLATED HEMOGLOBIN (HGB A1C): HEMOGLOBIN A1C: 7.7

## 2016-04-22 MED ORDER — ALPRAZOLAM 1 MG PO TABS: 1.0000 mg | ORAL_TABLET | Freq: Two times a day (BID) | ORAL | 0 refills | Status: DC | PRN

## 2016-04-22 MED ORDER — INSULIN GLARGINE 100 UNIT/ML SOLOSTAR PEN: PEN_INJECTOR | SUBCUTANEOUS | 1 refills | Status: DC

## 2016-04-22 NOTE — Assessment & Plan Note (Signed)
Obesity which is a contributing factor to his chronic underlying conditions. Recommend weight loss of 5-10% of current body weight through nutrition and physical activity. Due for cardiac catheterization tomorrow. Continue to monitor.

## 2016-04-22 NOTE — Assessment & Plan Note (Signed)
Indicates he has not fully compliant with CPAP and has purchased his own device to use at home. He does endorse that he does have paroxysmal nocturnal dyspnea on occasion. Denies daytime sleepiness. Encouraged to use CPAP and follow-up with pulmonology.

## 2016-04-22 NOTE — Assessment & Plan Note (Signed)
In office POCT A1c is 7.7 which is improved from 9.4. No adverse side effects or hypoglycemic readings. Diabetic foot exam up to date. Due for diabetic eye exam encouraged to be completed independently. Continue current dosage of metformin, linagliptin and Lantus. Continue to monitor blood sugars at home. Maintained on rosuvastatin and lisinopril for CAD risk reduction. Continue to work on nutrition. Continue to monitor.

## 2016-04-22 NOTE — Assessment & Plan Note (Signed)
Blood pressure remains above goal 140/90 with current regimen and no adverse side effects. Denies worst headache of life with no new symptoms of end organ damage noted physical exam. Recommend starting additional medication, however patient is scheduled for cardiac catheterization tomorrow. Continue current dosage of lisinopril-hydrochlorothiazide and follow low sodium diet. Recheck pending completion of cardiac catheterization.

## 2016-04-22 NOTE — Assessment & Plan Note (Signed)
Generalized anxiety disorder well maintained with current regimen and denies adverse side effects. Continue current dosage of alprazolam as needed. 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 Continue current dosage of alprazolam. [place] controlled substance database reviewed with no irregularities.
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
## 2016-04-22 NOTE — Patient Instructions (Addendum)
Thank you for choosing [facility].  SUMMARY AND INSTRUCTIONS:  Medication:  Please continue to take your medications as prescribed.   Your prescription(s) have been submitted to your pharmacy or been printed and provided for you. Please take as directed and contact our office if you believe you are having problem(s) with the medication(s) or have any questions.  Follow up:  If your symptoms worsen or fail to improve, please contact our office for further instruction, or in case of emergency go directly to the emergency room at the closest medical facility.   DIABETES CARE INSTRUCTIONS:  Current A1c:  Lab Results  Component Value Date   HGBA1C 7.7 04/22/2016    A1C Goal: <7.0%  Fasting Blood Sugar Goal: 80-130 Blood sugar check that you take after fasting for at least 8 hours which is usually in the morning before eating.  Post-prandial Blood Sugar Goal: <180 Blood sugar check approximately 1-2 hours after the start of a meal.  Diabetes Prevention Screens:  1.) Ensure you have an annual eye exam by an ophthalmologist or optometrist.   2,) Ensure you have an annual foot exam or when any changes are noted including new onset numbness/tingling or wounds. Check your feet daily!  3.) The American Diabetes Association recommends the Pneumovax vaccination against pneumonia every 5 years.  4.) We will check your kidney function with a urine test on an annual basis.

## 2016-04-23 ENCOUNTER — Encounter (HOSPITAL_COMMUNITY): Payer: Self-pay | Admitting: *Deleted

## 2016-04-23 ENCOUNTER — Encounter (HOSPITAL_COMMUNITY)
Admission: RE | Disposition: A | Discharge status: Home / Self Care | Payer: Self-pay | Source: Ambulatory Visit | Attending: Cardiovascular Disease

## 2016-04-23 ENCOUNTER — Ambulatory Visit (HOSPITAL_COMMUNITY)
Admission: RE | Admit: 2016-04-23 | Discharge: 2016-04-23 | Disposition: A | Discharge status: Home / Self Care | Payer: PPO | Source: Ambulatory Visit | Attending: Cardiovascular Disease | Admitting: Cardiovascular Disease

## 2016-04-23 ENCOUNTER — Ambulatory Visit: Payer: PPO | Admitting: Family

## 2016-04-23 DIAGNOSIS — Z8249 Family history of ischemic heart disease and other diseases of the circulatory system: Secondary | ICD-10-CM | POA: Insufficient documentation

## 2016-04-23 DIAGNOSIS — Z6836 Body mass index (BMI) 36.0-36.9, adult: Secondary | ICD-10-CM | POA: Insufficient documentation

## 2016-04-23 DIAGNOSIS — I25110 Atherosclerotic heart disease of native coronary artery with unstable angina pectoris: Secondary | ICD-10-CM | POA: Diagnosis not present

## 2016-04-23 DIAGNOSIS — Z87891 Personal history of nicotine dependence: Secondary | ICD-10-CM | POA: Insufficient documentation

## 2016-04-23 DIAGNOSIS — Z96652 Presence of left artificial knee joint: Secondary | ICD-10-CM | POA: Insufficient documentation

## 2016-04-23 DIAGNOSIS — Y712 Prosthetic and other implants, materials and accessory cardiovascular devices associated with adverse incidents: Secondary | ICD-10-CM | POA: Diagnosis not present

## 2016-04-23 DIAGNOSIS — I119 Hypertensive heart disease without heart failure: Secondary | ICD-10-CM | POA: Diagnosis not present

## 2016-04-23 DIAGNOSIS — T82855A Stenosis of coronary artery stent, initial encounter: Secondary | ICD-10-CM | POA: Insufficient documentation

## 2016-04-23 DIAGNOSIS — K219 Gastro-esophageal reflux disease without esophagitis: Secondary | ICD-10-CM | POA: Diagnosis not present

## 2016-04-23 DIAGNOSIS — G4733 Obstructive sleep apnea (adult) (pediatric): Secondary | ICD-10-CM | POA: Diagnosis not present

## 2016-04-23 DIAGNOSIS — Z89511 Acquired absence of right leg below knee: Secondary | ICD-10-CM | POA: Insufficient documentation

## 2016-04-23 DIAGNOSIS — E1159 Type 2 diabetes mellitus with other circulatory complications: Secondary | ICD-10-CM | POA: Diagnosis not present

## 2016-04-23 DIAGNOSIS — I200 Unstable angina: Secondary | ICD-10-CM

## 2016-04-23 DIAGNOSIS — E782 Mixed hyperlipidemia: Secondary | ICD-10-CM | POA: Insufficient documentation

## 2016-04-23 DIAGNOSIS — Z794 Long term (current) use of insulin: Secondary | ICD-10-CM | POA: Insufficient documentation

## 2016-04-23 DIAGNOSIS — I208 Other forms of angina pectoris: Secondary | ICD-10-CM | POA: Diagnosis present

## 2016-04-23 DIAGNOSIS — E1110 Type 2 diabetes mellitus with ketoacidosis without coma: Secondary | ICD-10-CM | POA: Insufficient documentation

## 2016-04-23 DIAGNOSIS — F411 Generalized anxiety disorder: Secondary | ICD-10-CM | POA: Diagnosis not present

## 2016-04-23 DIAGNOSIS — Z7902 Long term (current) use of antithrombotics/antiplatelets: Secondary | ICD-10-CM | POA: Diagnosis not present

## 2016-04-23 DIAGNOSIS — Z7982 Long term (current) use of aspirin: Secondary | ICD-10-CM | POA: Diagnosis not present

## 2016-04-23 DIAGNOSIS — Z955 Presence of coronary angioplasty implant and graft: Secondary | ICD-10-CM

## 2016-04-23 DIAGNOSIS — Z833 Family history of diabetes mellitus: Secondary | ICD-10-CM | POA: Insufficient documentation

## 2016-04-23 HISTORY — PX: CARDIAC CATHETERIZATION: SHX172

## 2016-04-23 LAB — BASIC METABOLIC PANEL
ANION GAP: 8 (ref 5–15)
BUN: 21 mg/dL — ABNORMAL HIGH (ref 6–20)
CALCIUM: 9.4 mg/dL (ref 8.9–10.3)
CO2: 28 mmol/L (ref 22–32)
Chloride: 104 mmol/L (ref 101–111)
Creatinine, Ser: 0.86 mg/dL (ref 0.61–1.24)
GLUCOSE: 173 mg/dL — AB (ref 65–99)
Potassium: 3.5 mmol/L (ref 3.5–5.1)
SODIUM: 140 mmol/L (ref 135–145)

## 2016-04-23 LAB — CBC
HCT: 40.9 % (ref 39.0–52.0)
Hemoglobin: 14 g/dL (ref 13.0–17.0)
MCH: 29 pg (ref 26.0–34.0)
MCHC: 34.2 g/dL (ref 30.0–36.0)
MCV: 84.7 fL (ref 78.0–100.0)
PLATELETS: 196 10*3/uL (ref 150–400)
RBC: 4.83 MIL/uL (ref 4.22–5.81)
RDW: 12.8 % (ref 11.5–15.5)
WBC: 7.9 10*3/uL (ref 4.0–10.5)

## 2016-04-23 LAB — POCT ACTIVATED CLOTTING TIME
ACTIVATED CLOTTING TIME: 246 s
Activated Clotting Time: 241 seconds

## 2016-04-23 LAB — PROTIME-INR
INR: 1.05
PROTHROMBIN TIME: 13.7 s (ref 11.4–15.2)

## 2016-04-23 LAB — GLUCOSE, CAPILLARY
GLUCOSE-CAPILLARY: 166 mg/dL — AB (ref 65–99)
Glucose-Capillary: 137 mg/dL — ABNORMAL HIGH (ref 65–99)

## 2016-04-23 SURGERY — LEFT HEART CATH AND CORONARY ANGIOGRAPHY
Anesthesia: LOCAL

## 2016-04-23 MED ORDER — NITROGLYCERIN 1 MG/10 ML FOR IR/CATH LAB
INTRA_ARTERIAL | Status: AC
  Filled 2016-04-23: qty 10

## 2016-04-23 MED ORDER — IOPAMIDOL (ISOVUE-370) INJECTION 76%
INTRAVENOUS | Status: DC | PRN
  Administered 2016-04-23: 130 mL via INTRA_ARTERIAL

## 2016-04-23 MED ORDER — SODIUM CHLORIDE 0.9 % IV SOLN
INTRAVENOUS | Status: DC
  Administered 2016-04-23: 06:00:00 via INTRAVENOUS

## 2016-04-23 MED ORDER — SODIUM CHLORIDE 0.9 % IV SOLN: 250.0000 mL | INTRAVENOUS | Status: DC | PRN

## 2016-04-23 MED ORDER — HEPARIN SODIUM (PORCINE) 1000 UNIT/ML IJ SOLN
INTRAMUSCULAR | Status: AC
  Filled 2016-04-23: qty 1

## 2016-04-23 MED ORDER — SODIUM CHLORIDE 0.9% FLUSH: 3.0000 mL | Freq: Two times a day (BID) | INTRAVENOUS | Status: DC

## 2016-04-23 MED ORDER — TICAGRELOR 90 MG PO TABS
ORAL_TABLET | ORAL | Status: DC | PRN
Start: 2016-04-23 — End: 2016-04-23
  Administered 2016-04-23: 180 mg via ORAL

## 2016-04-23 MED ORDER — IOPAMIDOL (ISOVUE-370) INJECTION 76%
INTRAVENOUS | Status: AC
  Filled 2016-04-23: qty 100

## 2016-04-23 MED ORDER — ASPIRIN 81 MG PO CHEW
81.0000 mg | CHEWABLE_TABLET | ORAL | Status: AC
  Administered 2016-04-23: 81 mg via ORAL

## 2016-04-23 MED ORDER — HEPARIN (PORCINE) IN NACL 2-0.9 UNIT/ML-% IJ SOLN
INTRAMUSCULAR | Status: AC
  Filled 2016-04-23: qty 1000

## 2016-04-23 MED ORDER — LABETALOL HCL 5 MG/ML IV SOLN: 10.0000 mg | INTRAVENOUS | Status: AC | PRN

## 2016-04-23 MED ORDER — SODIUM CHLORIDE 0.9% FLUSH: 3.0000 mL | INTRAVENOUS | Status: DC | PRN

## 2016-04-23 MED ORDER — ACETAMINOPHEN 325 MG PO TABS: 650.0000 mg | ORAL_TABLET | ORAL | Status: DC | PRN

## 2016-04-23 MED ORDER — NITROGLYCERIN 1 MG/10 ML FOR IR/CATH LAB
INTRA_ARTERIAL | Status: DC | PRN
  Administered 2016-04-23: 150 ug via INTRACORONARY

## 2016-04-23 MED ORDER — CLOPIDOGREL BISULFATE 75 MG PO TABS
ORAL_TABLET | ORAL | Status: AC
  Filled 2016-04-23: qty 1

## 2016-04-23 MED ORDER — HEPARIN (PORCINE) IN NACL 2-0.9 UNIT/ML-% IJ SOLN
INTRAMUSCULAR | Status: DC | PRN
  Administered 2016-04-23: 1000 mL

## 2016-04-23 MED ORDER — HEPARIN SODIUM (PORCINE) 1000 UNIT/ML IJ SOLN
INTRAMUSCULAR | Status: DC | PRN
  Administered 2016-04-23: 4000 [IU] via INTRAVENOUS
  Administered 2016-04-23 (×2): 6000 [IU] via INTRAVENOUS

## 2016-04-23 MED ORDER — ONDANSETRON HCL 4 MG/2ML IJ SOLN: 4.0000 mg | Freq: Four times a day (QID) | INTRAMUSCULAR | Status: DC | PRN

## 2016-04-23 MED ORDER — FENTANYL CITRATE (PF) 100 MCG/2ML IJ SOLN
INTRAMUSCULAR | Status: AC
  Filled 2016-04-23: qty 2

## 2016-04-23 MED ORDER — MIDAZOLAM HCL 2 MG/2ML IJ SOLN
INTRAMUSCULAR | Status: DC | PRN
  Administered 2016-04-23: 2 mg via INTRAVENOUS

## 2016-04-23 MED ORDER — HYDRALAZINE HCL 20 MG/ML IJ SOLN: 5.0000 mg | INTRAMUSCULAR | Status: AC | PRN

## 2016-04-23 MED ORDER — CLOPIDOGREL BISULFATE 75 MG PO TABS
75.0000 mg | ORAL_TABLET | Freq: Once | ORAL | Status: AC
  Administered 2016-04-23: 75 mg via ORAL

## 2016-04-23 MED ORDER — MIDAZOLAM HCL 2 MG/2ML IJ SOLN
INTRAMUSCULAR | Status: AC
  Filled 2016-04-23: qty 2

## 2016-04-23 MED ORDER — LIDOCAINE HCL (PF) 1 % IJ SOLN
INTRAMUSCULAR | Status: AC
  Filled 2016-04-23: qty 30

## 2016-04-23 MED ORDER — VERAPAMIL HCL 2.5 MG/ML IV SOLN
INTRAVENOUS | Status: AC
  Filled 2016-04-23: qty 2

## 2016-04-23 MED ORDER — ASPIRIN 81 MG PO CHEW
CHEWABLE_TABLET | ORAL | Status: AC
  Administered 2016-04-23: 81 mg via ORAL
  Filled 2016-04-23: qty 1

## 2016-04-23 MED ORDER — LIDOCAINE HCL (PF) 1 % IJ SOLN
INTRAMUSCULAR | Status: DC | PRN
  Administered 2016-04-23: 3 mL

## 2016-04-23 MED ORDER — ANGIOPLASTY BOOK
Freq: Once | Status: DC
  Filled 2016-04-23: qty 1

## 2016-04-23 MED ORDER — VERAPAMIL HCL 2.5 MG/ML IV SOLN
INTRAVENOUS | Status: DC | PRN
  Administered 2016-04-23: 10 mL via INTRA_ARTERIAL

## 2016-04-23 MED ORDER — TICAGRELOR 90 MG PO TABS: 90.0000 mg | ORAL_TABLET | Freq: Two times a day (BID) | ORAL | Status: DC

## 2016-04-23 MED ORDER — SODIUM CHLORIDE 0.9 % IV SOLN: INTRAVENOUS | Status: AC

## 2016-04-23 MED ORDER — FENTANYL CITRATE (PF) 100 MCG/2ML IJ SOLN
INTRAMUSCULAR | Status: DC | PRN
  Administered 2016-04-23: 25 ug via INTRAVENOUS

## 2016-04-23 SURGICAL SUPPLY — 22 items
BALLN MAVERICK OTW 15X2.5 (BALLOONS) ×2
BALLN ~~LOC~~ MOZEC 3.0X10 (BALLOONS) ×2
BALLOON MAVERICK OTW 15X2.5 (BALLOONS) IMPLANT
BALLOON ~~LOC~~ MOZEC 3.0X10 (BALLOONS) IMPLANT
CATH EXPO 5F FL3.5 (CATHETERS) ×1 IMPLANT
CATH INFINITI 5FR ANG PIGTAIL (CATHETERS) ×2 IMPLANT
CATH INFINITI JR4 5F (CATHETERS) ×1 IMPLANT
CATH VISTA GUIDE 6FR AL1 (CATHETERS) ×1 IMPLANT
DEVICE RAD COMP TR BAND LRG (VASCULAR PRODUCTS) ×1 IMPLANT
GLIDESHEATH SLEND SS 6F .021 (SHEATH) ×1 IMPLANT
GUIDEWIRE INQWIRE 1.5J.035X260 (WIRE) IMPLANT
INQWIRE 1.5J .035X260CM (WIRE) ×2
KIT ENCORE 26 ADVANTAGE (KITS) ×1 IMPLANT
KIT HEART LEFT (KITS) ×2 IMPLANT
PACK CARDIAC CATHETERIZATION (CUSTOM PROCEDURE TRAY) ×2 IMPLANT
STENT SYNERGY DES 2.75X20 (Permanent Stent) ×1 IMPLANT
SYR MEDRAD MARK V 150ML (SYRINGE) ×2 IMPLANT
TRANSDUCER W/STOPCOCK (MISCELLANEOUS) ×2 IMPLANT
TUBING CIL FLEX 10 FLL-RA (TUBING) ×2 IMPLANT
WIRE COUGAR XT STRL 300CM (WIRE) ×1 IMPLANT
WIRE HI TORQ VERSACORE-J 145CM (WIRE) ×1 IMPLANT
WIRE MAILMAN 300CM (WIRE) ×1 IMPLANT

## 2016-04-23 NOTE — Progress Notes (Signed)
Assumed care of pt from Elmer Ramp, Therapist, sports. No change noted.

## 2016-04-23 NOTE — Discharge Summary (Signed)
Discharge Summary    Patient ID: Carl Mccoy,  MRN: [id number], DOB/AGE: 07-May-1955 60 y.o.  Admit date: 04/23/2016 Discharge date: 04/23/2016  Primary Care Provider: Mauricio Po Primary Cardiologist: Dr. Domenic Polite   Discharge Diagnoses    Active Problems:   Exertional angina [facility])   Allergies No Known Allergies  Diagnostic Studies/Procedures    Left heart cath 04/23/16 1. Severe single-vessel coronary artery disease with severe diffuse in-stent restenosis of the mid right coronary artery, treated successfully with a drug-eluting stent. 2. Continued patency of the stented segment in the LAD 3. No significant stenosis in the left circumflex 4. Normal LV systolic function  Recommendations: Would continue with long-term aggressive medical management and either long-term dual antiplatelet therapy or consideration of the twilight trial which will randomize him to brilinta alone after 3 months of DAPT versus continue DAPT with aspirin and brilinta. Plan same-day discharge if clinically stable per protocol.  _____________   History of Present Illness     Carl Mccoy is a 60 y.o. male with a past medical history of CAD, right BKA, HTN, HLD, OSA, and DM. He presented for a routine follow-up visit with Dr. Domenic Polite on 04/14/16. 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  He reported Dr. Domenic Polite that over the last month he has become significantly more short of breath with usual activities. When he gets out of his truck to do anything physical and climbs back in he experiences chest tightness consistent with his angina. These symptoms have been increasing.  He reports compliance with medications except that he has not been taking aspirin. Prescription medicines are reviewed below. He has continued on Plavix.  Cardiac catheterization done back in January revealed in-stent restenosis within the mid RCA that was treated with angioplasty. He states that the present  symptoms are similar to what he experienced at that time.  I reviewed his ECG today which shows sinus rhythm with nonspecific ST-T abnormalities. He was referred for heart catheterization.   Hospital Course   Left heart catheterization showed severe single-vessel CAD with severe diffuse in stent restenosis of the mid RCA. He had DES placed to his mid RCA. His LAD stent was patent.  He was initially going to start on Brilinta, but this caused him to have severe SOB, so he will continue on his daily Plavix. We will restart his metformin in 48 hours.   His right radial site was stable without hematoma.   _____________  Discharge Vitals Blood pressure (!) 154/82, pulse 69, temperature 97.7 F (36.5 C), temperature source Oral, resp. rate 17, height 5\' 11"  (1.803 m), weight 266 lb (120.7 kg), SpO2 99 %.  Filed Weights   04/23/16 0538  Weight: 266 lb (120.7 kg)    Labs & Radiologic Studies     CBC  Recent Labs  04/23/16 0604  WBC 7.9  HGB 14.0  HCT 40.9  MCV 84.7  PLT 123456   Basic Metabolic Panel  Recent Labs  04/23/16 0604  NA 140  K 3.5  CL 104  CO2 28  GLUCOSE 173*  BUN 21*  CREATININE 0.86  CALCIUM 9.4   Hemoglobin A1C  Recent Labs  04/22/16 1659  HGBA1C 7.7    Dg Chest 2 View  Result Date: 04/15/2016 CLINICAL DATA:  Preoperative examination prior to cardiac catheterization. History of coronary artery disease, hypertension, diabetes, former smoker. EXAM: CHEST  2 VIEW COMPARISON:  PA and lateral chest x-ray of February 14, 2015 FINDINGS: The  lungs are well-expanded. The interstitial markings are coarse though stable. There is no alveolar infiltrate or pleural effusion. The heart and pulmonary vascularity are normal. The mediastinum is normal in width. The bony thorax is unremarkable. IMPRESSION: There is no acute cardiopulmonary abnormality. Electronically Signed   By: David  Martinique M.D.   On: 04/15/2016 07:15    Disposition   Pt is being discharged home  today in good condition.  Follow-up Plans & Appointments    Follow-up Information    Jory Sims, NP Follow up on 05/07/2016.   Specialties:  Nurse Practitioner, Radiology, Cardiology Why:  at 2:00 for hospital follow up Contact information: [place] [address] [phone]          Discharge Instructions    Amb Referral to Cardiac Rehabilitation    Complete by:  As directed    Diagnosis:   PTCA Coronary Stents     Diet - low sodium heart healthy    Complete by:  As directed    Discharge instructions    Complete by:  As directed    NO HEAVY LIFTING OR SEXUAL ACTIVITY for 7 DAYS. NO DRIVING for 3-5 DAYS. NO SOAKING BATHS, HOT TUBS, POOLS, ETC., for 7 DAYS  Radial Site Care Refer to this sheet in the next few weeks. These instructions provide you with information on caring for yourself after your procedure. Your caregiver may also give you more specific instructions. Your treatment has been planned according to current medical practices, but problems sometimes occur. Call your caregiver if you have any problems or questions after your procedure. HOME CARE INSTRUCTIONS You may shower the day after the procedure.Remove the bandage (dressing) and gently wash the site with plain soap and water.Gently pat the site dry.  Do not apply powder or lotion to the site.  Do not submerge the affected site in water for 3 to 5 days.  Inspect the site at least twice daily.  Do not flex or bend the affected arm for 24 hours.  No lifting over 5 pounds (2.3 kg) for 5 days after your procedure.  Do not drive home if you are discharged the same day of the procedure. Have someone else drive you.  You may drive 24 hours after the procedure unless otherwise instructed by your caregiver.  What to expect: Any bruising will usually fade within 1 to 2 weeks.  Blood that collects in the tissue (hematoma) may be painful to the touch. It should usually decrease in size and tenderness  within 1 to 2 weeks.  SEEK IMMEDIATE MEDICAL CARE IF: You have unusual pain at the radial site.  You have redness, warmth, swelling, or pain at the radial site.  You have drainage (other than a small amount of blood on the dressing).  You have chills.  You have a fever or persistent symptoms for more than 72 hours.  You have a fever and your symptoms suddenly get worse.  Your arm becomes pale, cool, tingly, or numb.  You have heavy bleeding from the site. Hold pressure on the site.   Increase activity slowly    Complete by:  As directed       Discharge Medications   Current Discharge Medication List    CONTINUE these medications which have NOT CHANGED   Details  ALPRAZolam (XANAX) 1 MG tablet Take 1 tablet (1 mg total) by mouth 2 (two) times daily as needed. for anxiety Qty: 60 tablet, Refills: 0   Associated Diagnoses: Generalized  anxiety disorder    aspirin EC 81 MG tablet Take 81 mg by mouth daily.    clopidogrel (PLAVIX) 75 MG tablet TAKE 1 TABLET BY MOUTH EVERY DAY WITH BREAKFAST Qty: 90 tablet, Refills: 3    fenofibrate 160 MG tablet TAKE 1 TABLET(160 MG) BY MOUTH DAILY Qty: 90 tablet, Refills: 1   Associated Diagnoses: Mixed hyperlipidemia; Encounter for immunization    Insulin Glargine (LANTUS SOLOSTAR) 100 UNIT/ML Solostar Pen INJECT 90 UNITS SUBCUTANEOUS EVERY MORNING AND EVERY NIGHT AT BEDTIME Qty: 45 mL, Refills: 1   Associated Diagnoses: Type 2 diabetes mellitus with other circulatory complication, with long-term current use of insulin ([place]); Uncontrolled type 2 diabetes mellitus with ketoacidosis without coma, with long-term current use of insulin (HCC)    linagliptin (TRADJENTA) 5 MG TABS tablet Take 1 tablet (5 mg total) by mouth daily. Qty: 90 tablet, Refills: 1   Associated Diagnoses: Type 2 diabetes mellitus with other circulatory complication, with long-term current use of insulin ([place]); Encounter for immunization    lisinopril-hydrochlorothiazide  (PRINZIDE,ZESTORETIC) 20-25 MG tablet Take 1 tablet by mouth daily. Qty: 90 tablet, Refills: 3   Associated Diagnoses: Essential hypertension, benign; Encounter for immunization    metFORMIN (GLUCOPHAGE) 500 MG tablet Take 1 tablet (500 mg total) by mouth 2 (two) times daily. Qty: 180 tablet, Refills: 1   Associated Diagnoses: Type 2 diabetes mellitus with other circulatory complication, with long-term current use of insulin ([place]); Encounter for immunization    pantoprazole (PROTONIX) 40 MG tablet Take 1 tablet (40 mg total) by mouth daily. Qty: 90 tablet, Refills: 1   Associated Diagnoses: Encounter for immunization    rosuvastatin (CRESTOR) 5 MG tablet Take 1 tablet (5 mg total) by mouth daily. Qty: 90 tablet, Refills: 1   Associated Diagnoses: Type 2 diabetes mellitus with other circulatory complication, with long-term current use of insulin ([place]); Mixed hyperlipidemia; Encounter for immunization    Tetrahydrozoline HCl (VISINE OP) Place 2 drops into both eyes daily as needed (itching).    nitroGLYCERIN (NITROSTAT) 0.4 MG SL tablet Place 1 tablet (0.4 mg total) under the tongue every 5 (five) minutes as needed for chest pain. Qty: 25 tablet, Refills: 2            Outstanding Labs/Studies     Duration of Discharge Encounter   Greater than 30 minutes including physician time.  Signed, Arbutus Leas NP 04/23/2016, 2:35 PM

## 2016-04-23 NOTE — Interval H&P Note (Signed)
Cath Lab Visit (complete for each Cath Lab visit)  Clinical Evaluation Leading to the Procedure:   ACS: No.  Non-ACS:    Anginal Classification: CCS III  Anti-ischemic medical therapy: No Therapy  Non-Invasive Test Results: No non-invasive testing performed  Prior CABG: No previous CABG      History and Physical Interval Note:  04/23/2016 7:37 AM  Carl Mccoy  has presented today for surgery, with the diagnosis of excellerated angina  The various methods of treatment have been discussed with the patient and family. After consideration of risks, benefits and other options for treatment, the patient has consented to  Procedure(s): Left Heart Cath and Coronary Angiography (N/A) as a surgical intervention .  The patient's history has been reviewed, patient examined, no change in status, stable for surgery.  I have reviewed the patient's chart and labs.  Questions were answered to the patient's satisfaction.     Sherren Mocha
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
## 2016-04-23 NOTE — Discharge Instructions (Signed)
°  HOLD METFORMIN FOR 48 HOURS. MAY RESUME ON MON MORNING.  CONTINUE PLAVIX AND ASPIRIN DAILY.  Radial Site Care Introduction Refer to this sheet in the next few weeks. These instructions provide you with information about caring for yourself after your procedure. Your health care provider may also give you more specific instructions. Your treatment has been planned according to current medical practices, but problems sometimes occur. Call your health care provider if you have any problems or questions after your procedure. What can I expect after the procedure? After your procedure, it is typical to have the following:  Bruising at the radial site that usually fades within 1-2 weeks.  Blood collecting in the tissue (hematoma) that may be painful to the touch. It should usually decrease in size and tenderness within 1-2 weeks. Follow these instructions at home:  Take medicines only as directed by your health care provider.  You may shower 24-48 hours after the procedure or as directed by your health care provider. Remove the bandage (dressing) and gently wash the site with plain soap and water. Pat the area dry with a clean towel. Do not rub the site, because this may cause bleeding.  Do not take baths, swim, or use a hot tub until your health care provider approves.  Check your insertion site every day for redness, swelling, or drainage.  Do not apply powder or lotion to the site.  Do not flex or bend the affected arm for 24 hours or as directed by your health care provider.  Do not push or pull heavy objects with the affected arm for 24 hours or as directed by your health care provider.  Do not lift over 10 lb (4.5 kg) for 5 days after your procedure or as directed by your health care provider.  Ask your health care provider when it is okay to:  Return to work or school.  Resume usual physical activities or sports.  Resume sexual activity.  Do not drive home if you are  discharged the same day as the procedure. Have someone else drive you.  You may drive 24 hours after the procedure unless otherwise instructed by your health care provider.  Do not operate machinery or power tools for 24 hours after the procedure.  If your procedure was done as an outpatient procedure, which means that you went home the same day as your procedure, a responsible adult should be with you for the first 24 hours after you arrive home.  Keep all follow-up visits as directed by your health care provider. This is important. Contact a health care provider if:  You have a fever.  You have chills.  You have increased bleeding from the radial site. Hold pressure on the site. Get help right away if:  You have unusual pain at the radial site.  You have redness, warmth, or swelling at the radial site.  You have drainage (other than a small amount of blood on the dressing) from the radial site.  The radial site is bleeding, and the bleeding does not stop after 30 minutes of holding steady pressure on the site.  Your arm or hand becomes pale, cool, tingly, or numb. This information is not intended to replace advice given to you by your health care provider. Make sure you discuss any questions you have with your health care provider. Document Released: 05/22/2010 Document Revised: 09/25/2015 Document Reviewed: 11/05/2013  2017 [employer]

## 2016-04-23 NOTE — Progress Notes (Signed)
Ed completed with pt and wife. Voiced understanding. I explained Brilinta but now apparently he will be on Plavix. RN to explain. Will refer to [facility]. He is wanting to ex more but cannot walk long distance. 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 He asked appropriate questions regarding his diet.  Playa Fortuna, ACSM 2:13 PM 04/23/2016

## 2016-04-27 ENCOUNTER — Encounter (HOSPITAL_COMMUNITY): Payer: Self-pay | Admitting: Cardiovascular Disease

## 2016-04-28 ENCOUNTER — Other Ambulatory Visit: Payer: Self-pay | Admitting: Nurse Practitioner

## 2016-04-29 NOTE — Telephone Encounter (Signed)
metFORMIN (GLUCOPHAGE) 500 MG tablet  Medication  Date: 12/26/2015 Department: [facility] Ordering/Authorizing: Golden Circle, FNP  Order Providers   Prescribing Provider Encounter Provider  Golden Circle, [place], Petros  Supervision Information   Supervising Provider Type of Supervision  Hoyt Koch, MD Supervision Required  Medication Detail    Disp Refills Start End   metFORMIN (GLUCOPHAGE) 500 MG tablet 180 tablet 1 12/26/2015    Sig - Route: Take 1 tablet (500 mg total) by mouth 2 (two) times daily. - Oral   Notes to Pharmacy: **Resume on 05/25/2015.   E-Prescribing Status: Receipt confirmed by pharmacy (12/26/2015 4:53 PM EDT)   Associated Diagnoses   Type 2 diabetes mellitus with other circulatory complication, with long-term current use of insulin ([place])     Encounter for immunization     Pharmacy   [id number] - [place], [place]. [address]

## 2016-04-30 ENCOUNTER — Ambulatory Visit (INDEPENDENT_AMBULATORY_CARE_PROVIDER_SITE_OTHER): Payer: PPO | Admitting: Internal Medicine

## 2016-04-30 VITALS — BP 124/70 | HR 91 | Temp 99.5°F | Resp 18 | Wt 267.0 lb

## 2016-04-30 DIAGNOSIS — J209 Acute bronchitis, unspecified: Secondary | ICD-10-CM

## 2016-04-30 MED ORDER — AZITHROMYCIN 250 MG PO TABS: ORAL_TABLET | ORAL | 0 refills | Status: DC

## 2016-04-30 MED ORDER — METHYLPREDNISOLONE ACETATE 80 MG/ML IJ SUSP
80.0000 mg | Freq: Once | INTRAMUSCULAR | Status: AC
  Administered 2016-04-30: 80 mg via INTRAMUSCULAR

## 2016-04-30 MED ORDER — PROMETHAZINE-CODEINE 6.25-10 MG/5ML PO SYRP: 5.0000 mL | ORAL_SOLUTION | Freq: Four times a day (QID) | ORAL | 0 refills | Status: DC | PRN

## 2016-04-30 NOTE — Progress Notes (Signed)
Pre visit review using our clinic review tool, if applicable. No additional management support is needed unless otherwise documented below in the visit note. 
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
## 2016-04-30 NOTE — Patient Instructions (Signed)

## 2016-05-02 ENCOUNTER — Encounter: Payer: Self-pay | Admitting: Internal Medicine

## 2016-05-02 NOTE — Assessment & Plan Note (Signed)
He gets this yearly -likely bacterial with bronchospasm Zpak Depomedrol 80 mg IM x 1 Cough syrup with codeine Per patient sugars are well controlled  Call if no improvement
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
## 2016-05-07 ENCOUNTER — Encounter: Payer: Self-pay | Admitting: Adult Health

## 2016-05-07 ENCOUNTER — Encounter: Payer: PPO | Admitting: Adult Health

## 2016-05-07 NOTE — Progress Notes (Signed)
Name: Carl Mccoy    DOB: 1956/04/06  Age: 61 y.o.  MR#: [id number]       PCP:  Mauricio Po, FNP      Insurance: Payor: [employer] / Plan: Tenn[employer] Must / Product Type: *No Product type* /   CC:   No chief complaint on file.   VS Vitals:   05/07/16 1426  BP: 138/80  Pulse: 76  SpO2: 94%  Weight: 258 lb (117 kg)  Height: 5\' 11"  (1.803 m)    Weights Current Weight  05/07/16 258 lb (117 kg)  04/30/16 267 lb (121.1 kg)  04/23/16 266 lb (120.7 kg)    Blood Pressure  BP Readings from Last 3 Encounters:  05/07/16 138/80  04/30/16 124/70  04/23/16 (!) 171/88     Admit date:  (Not on file) Last encounter with RMR:  Visit date not found   Allergy Brilinta [ticagrelor]  Current Outpatient Prescriptions  Medication Sig Dispense Refill  . ALPRAZolam (XANAX) 1 MG tablet Take 1 tablet (1 mg total) by mouth 2 (two) times daily as needed. for anxiety 60 tablet 0  . aspirin EC 81 MG tablet Take 81 mg by mouth daily.    . clopidogrel (PLAVIX) 75 MG tablet TAKE 1 TABLET BY MOUTH EVERY DAY WITH BREAKFAST (Patient taking differently: Take 75 mg by mouth daily. TAKE 1 TABLET BY MOUTH EVERY DAY WITH BREAKFAST) 90 tablet 3  . fenofibrate 160 MG tablet TAKE 1 TABLET(160 MG) BY MOUTH DAILY 90 tablet 1  . Insulin Glargine (LANTUS SOLOSTAR) 100 UNIT/ML Solostar Pen INJECT 90 UNITS SUBCUTANEOUS EVERY MORNING AND EVERY NIGHT AT BEDTIME (Patient taking differently: Inject 90 Units into the skin 2 (two) times daily. ) 45 mL 1  . linagliptin (TRADJENTA) 5 MG TABS tablet Take 1 tablet (5 mg total) by mouth daily. 90 tablet 1  . lisinopril-hydrochlorothiazide (PRINZIDE,ZESTORETIC) 20-25 MG tablet Take 1 tablet by mouth daily. 90 tablet 3  . metFORMIN (GLUCOPHAGE) 500 MG tablet Take 1 tablet (500 mg total) by mouth 2 (two) times daily. 180 tablet 1  . nitroGLYCERIN (NITROSTAT) 0.4 MG SL tablet Place 1 tablet (0.4 mg total) under the tongue every 5 (five) minutes as needed for chest  pain. 25 tablet 2  . pantoprazole (PROTONIX) 40 MG tablet Take 1 tablet (40 mg total) by mouth daily. 90 tablet 1  . promethazine-codeine (PHENERGAN WITH CODEINE) 6.25-10 MG/5ML syrup Take 5 mLs by mouth every 6 (six) hours as needed for cough. 120 mL 0  . rosuvastatin (CRESTOR) 5 MG tablet Take 1 tablet (5 mg total) by mouth daily. 90 tablet 1  . Tetrahydrozoline HCl (VISINE OP) Place 2 drops into both eyes daily as needed (itching).     No current facility-administered medications for this visit.     Discontinued Meds:    Medications Discontinued During This Encounter  Medication Reason  . azithromycin (ZITHROMAX) 250 MG tablet Error    Patient Active Problem List   Diagnosis Date Noted  . Exertional angina (HCC) 04/23/2016  . Food poisoning due to bacteria 10/27/2015  . Generalized anxiety disorder 07/17/2015  . CAD (coronary artery disease)   . Hypertensive heart disease   . OSA on CPAP   . Hyperlipidemia   . Abnormal myocardial perfusion study   . Accelerating angina ([place])   . Acute bronchitis 04/19/2015  . Shortness of breath 02/14/2015  . Obesity (BMI 30-39.9) 02/14/2015  . Coronary atherosclerosis of native coronary artery 03/16/2013  . Mixed hyperlipidemia 02/02/2013  .  Obstructive sleep apnea 02/02/2013  . Essential hypertension, benign 02/01/2013  . Type 2 diabetes mellitus (HCC) 02/01/2013    LABS    Component Value Date/Time   NA 140 04/23/2016 0604   NA 139 12/26/2015 1708   NA 138 07/17/2015 1658   K 3.5 04/23/2016 0604   K 3.8 12/26/2015 1708   K 3.8 07/17/2015 1658   CL 104 04/23/2016 0604   CL 101 12/26/2015 1708   CL 100 07/17/2015 1658   CO2 28 04/23/2016 0604   CO2 29 12/26/2015 1708   CO2 31 07/17/2015 1658   GLUCOSE 173 (H) 04/23/2016 0604   GLUCOSE 248 (H) 12/26/2015 1708   GLUCOSE 238 (H) 07/17/2015 1658   BUN 21 (H) 04/23/2016 0604   BUN 27 (H) 12/26/2015 1708   BUN 18 07/17/2015 1658   CREATININE 0.86 04/23/2016 0604   CREATININE  0.95 12/26/2015 1708   CREATININE 0.86 07/17/2015 1658   CALCIUM 9.4 04/23/2016 0604   CALCIUM 8.9 12/26/2015 1708   CALCIUM 9.6 07/17/2015 1658   GFRNONAA >60 04/23/2016 0604   GFRNONAA >60 05/24/2015 0551   GFRNONAA >60 05/23/2015 0639   GFRAA >60 04/23/2016 0604   GFRAA >60 05/24/2015 0551   GFRAA >60 05/23/2015 0639   CMP     Component Value Date/Time   NA 140 04/23/2016 0604   K 3.5 04/23/2016 0604   CL 104 04/23/2016 0604   CO2 28 04/23/2016 0604   GLUCOSE 173 (H) 04/23/2016 0604   BUN 21 (H) 04/23/2016 0604   CREATININE 0.86 04/23/2016 0604   CALCIUM 9.4 04/23/2016 0604   PROT 6.6 12/26/2015 1708   ALBUMIN 4.1 12/26/2015 1708   AST 16 12/26/2015 1708   ALT 16 12/26/2015 1708   ALKPHOS 57 12/26/2015 1708   BILITOT 0.3 12/26/2015 1708   GFRNONAA >60 04/23/2016 0604   GFRAA >60 04/23/2016 0604       Component Value Date/Time   WBC 7.9 04/23/2016 0604   WBC 6.0 05/24/2015 0551   WBC 5.0 05/23/2015 0639   HGB 14.0 04/23/2016 0604   HGB 13.2 05/24/2015 0551   HGB 13.7 05/23/2015 0639   HCT 40.9 04/23/2016 0604   HCT 39.7 05/24/2015 0551   HCT 40.9 05/23/2015 0639   MCV 84.7 04/23/2016 0604   MCV 85.2 05/24/2015 0551   MCV 85.6 05/23/2015 0639    Lipid Panel  No results found for: CHOL, TRIG, HDL, CHOLHDL, VLDL, LDLCALC, LDLDIRECT  ABG No results found for: PHART, PCO2ART, PO2ART, HCO3, TCO2, ACIDBASEDEF, O2SAT   No results found for: TSH BNP (last 3 results) No results for input(s): BNP in the last 8760 hours.  ProBNP (last 3 results) No results for input(s): PROBNP in the last 8760 hours.  Cardiac Panel (last 3 results) No results for input(s): CKTOTAL, CKMB, TROPONINI, RELINDX in the last 72 hours.  Iron/TIBC/Ferritin/ %Sat No results found for: IRON, TIBC, FERRITIN, IRONPCTSAT   EKG Orders placed or performed during the hospital encounter of 04/23/16  . EKG 12-Lead  . EKG 12-Lead  . EKG 12-Lead immediately post procedure  . EKG 12-Lead  immediately post procedure  . EKG     Prior Assessment and Plan Problem List as of 05/07/2016 Reviewed: 04/30/2016  4:40 PM by Melene Plan, CMA     Cardiovascular and Mediastinum   Essential hypertension, benign   Last Assessment & Plan 04/22/2016 Office Visit Written 04/22/2016 10:16 PM by Golden Circle, FNP    Blood pressure remains above goal 140/90  with current regimen and no adverse side effects. Denies worst headache of life with no new symptoms of end organ damage noted physical exam. Recommend starting additional medication, however patient is scheduled for cardiac catheterization tomorrow. Continue current dosage of lisinopril-hydrochlorothiazide and follow low sodium diet. Recheck pending completion of cardiac catheterization.       Coronary atherosclerosis of native coronary artery   Last Assessment & Plan 04/19/2014 Office Visit Written 04/19/2014  4:31 PM by Satira Sark, MD    No active angina symptoms. Continues on aspirin, Plavix, Prinzide, and Crestor. No changes to current regimen. Follow-up in 6 months.      Accelerating angina [facility])   CAD (coronary artery disease)   Last Assessment & Plan 12/26/2015 Office Visit Written 12/26/2015 10:00 PM by Golden Circle, FNP    Monitored through risk factor evaluation and modification including hyperlipidemia, type 2 diabetes, and hypertension. No chest pain currently. Continue to monitor.      Hypertensive heart disease   Exertional angina [facility])     Respiratory   Obstructive sleep apnea   Last Assessment & Plan 03/14/2015 Office Visit Written 03/15/2015  6:24 AM by Tanda Rockers, MD    Although his snoring and AM HA better on present system his epworth is high and he has gained wt since the original titration so may need another titration or at least a review of a download, not just new hoses/ mask etc.  Will request maint check of present machine and download and if not adequate either an autoset with download or  a formal repeat titration       Acute bronchitis   Last Assessment & Plan 04/30/2016 Office Visit Written 05/02/2016  7:57 AM by Binnie Rail, MD    He gets this yearly -likely bacterial with bronchospasm Zpak Depomedrol 80 mg IM x 1 Cough syrup with codeine Per patient sugars are well controlled  Call if no improvement      OSA on CPAP   Last Assessment & Plan 04/22/2016 Office Visit Written 04/22/2016 10:18 PM by Golden Circle, FNP    Indicates he has not fully compliant with CPAP and has purchased his own device to use at home. He does endorse that he does have paroxysmal nocturnal dyspnea on occasion. Denies daytime sleepiness. Encouraged to use CPAP and follow-up with pulmonology.        Endocrine   Type 2 diabetes mellitus [facility])   Last Assessment & Plan 04/22/2016 Office Visit Written 04/22/2016 10:12 PM by Golden Circle, FNP    In office POCT A1c is 7.7 which is improved from 9.4. No adverse side effects or hypoglycemic readings. Diabetic foot exam up to date. Due for diabetic eye exam encouraged to be completed independently. Continue current dosage of metformin, linagliptin and Lantus. Continue to monitor blood sugars at home. Maintained on rosuvastatin and lisinopril for CAD risk reduction. Continue to work on nutrition. Continue to monitor.         Other   Mixed hyperlipidemia   Last Assessment & Plan 12/26/2015 Office Visit Written 12/26/2015 10:02 PM by Golden Circle, FNP    Hyperlipidemia and due for lipid profile at next office visit. No adverse side effects or myalgias. Continue current dosage of rosuvastatin.      Shortness of breath   Last Assessment & Plan 04/19/2015 Office Visit Written 04/19/2015 10:11 AM by Cassandria Anger, MD    Chronic Stress test, Pulm appt pending per pt  Obesity (BMI 30-39.9)   Last Assessment & Plan 04/22/2016 Office Visit Written 04/22/2016 10:18 PM by Golden Circle, FNP    Obesity which is a contributing factor  to his chronic underlying conditions. Recommend weight loss of 5-10% of current body weight through nutrition and physical activity. Due for cardiac catheterization tomorrow. Continue to monitor.      Abnormal myocardial perfusion study   Hyperlipidemia   Generalized anxiety disorder   Last Assessment & Plan 04/22/2016 Office Visit Written 04/22/2016 10:17 PM by Golden Circle, FNP    Generalized anxiety disorder well maintained with current regimen and denies adverse side effects. Continue current dosage of alprazolam as needed. 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 Continue current dosage of alprazolam. [place] controlled substance database reviewed with no irregularities.      Food poisoning due to bacteria   Last Assessment & Plan 10/27/2015 Office Visit Written 10/27/2015 10:08 AM by Binnie Rail, MD    Likely food poisoning Will treat with cipro 500 mg BID x 3 days Phenergan as needed for nausea Stressed fluids If no improvement will order stool cultures, blood work, but most likely he should have improvement in the next 48 hrs Note given for work Call if no improvement          Imaging: Dg Chest 2 View  Result Date: 04/15/2016 CLINICAL DATA:  Preoperative examination prior to cardiac catheterization. History of coronary artery disease, hypertension, diabetes, former smoker. EXAM: CHEST  2 VIEW COMPARISON:  PA and lateral chest x-ray of February 14, 2015 FINDINGS: The lungs are well-expanded. The interstitial markings are coarse though stable. There is no alveolar infiltrate or pleural effusion. The heart and pulmonary vascularity are normal. The mediastinum is normal in width. The bony thorax is unremarkable. IMPRESSION: There is no acute cardiopulmonary abnormality. Electronically Signed   By: David  Martinique M.D.   On: 04/15/2016 07:15

## 2016-05-07 NOTE — Patient Instructions (Signed)
Your physician recommends that you schedule a follow-up appointment in: 3 months with Jory Sims NP    Your physician recommends that you continue on your current medications as directed. Please refer to the Current Medication list given to you today.    If you need a refill on your cardiac medications before your next appointment, please call your pharmacy.     Thank you for choosing [facility] !

## 2016-05-31 ENCOUNTER — Telehealth: Payer: Self-pay | Admitting: *Deleted

## 2016-05-31 DIAGNOSIS — F411 Generalized anxiety disorder: Secondary | ICD-10-CM

## 2016-05-31 MED ORDER — ALPRAZOLAM 1 MG PO TABS: 1.0000 mg | ORAL_TABLET | Freq: Two times a day (BID) | ORAL | 2 refills | Status: DC | PRN

## 2016-05-31 NOTE — Telephone Encounter (Signed)
Medication printed and to be faxed.  

## 2016-05-31 NOTE — Telephone Encounter (Signed)
Rx did not print Carl Mccoy is off today went ahead and call refill into [facility] had to leave on vm...[name]

## 2016-05-31 NOTE — Telephone Encounter (Signed)
Left msg on triage requesting a refill on his alprazolam. Requesting 90 day supply. Marya Amsler is out of office today will hold until he return tomorrow for approval.../lmb

## 2016-06-03 ENCOUNTER — Other Ambulatory Visit: Payer: Self-pay | Admitting: Nurse Practitioner

## 2016-06-21 ENCOUNTER — Other Ambulatory Visit: Payer: Self-pay | Admitting: Family

## 2016-06-21 DIAGNOSIS — E1159 Type 2 diabetes mellitus with other circulatory complications: Secondary | ICD-10-CM

## 2016-06-21 DIAGNOSIS — E1110 Type 2 diabetes mellitus with ketoacidosis without coma: Secondary | ICD-10-CM

## 2016-06-21 DIAGNOSIS — Z794 Long term (current) use of insulin: Principal | ICD-10-CM

## 2016-06-28 ENCOUNTER — Telehealth: Payer: Self-pay | Admitting: *Deleted

## 2016-06-28 MED ORDER — SITAGLIPTIN PHOSPHATE 100 MG PO TABS: 100.0000 mg | ORAL_TABLET | Freq: Every day | ORAL | 2 refills | Status: DC

## 2016-06-28 NOTE — Telephone Encounter (Signed)
Rec'd call from pt/wife insurance will not cover the Trajenta the alternative is Januvia. Requesting rx to be sent to pharmacy...Carl Mccoy

## 2016-06-28 NOTE — Telephone Encounter (Signed)
Januvia sent to pharmacy. Tradjenta discontinued.

## 2016-06-29 NOTE — Telephone Encounter (Signed)
Patient informed. 
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
## 2016-07-09 ENCOUNTER — Encounter: Payer: Self-pay | Admitting: Family

## 2016-07-09 ENCOUNTER — Ambulatory Visit (INDEPENDENT_AMBULATORY_CARE_PROVIDER_SITE_OTHER): Payer: PPO | Admitting: Family

## 2016-07-09 VITALS — BP 142/90 | HR 71 | Temp 97.9°F | Resp 16 | Ht 71.0 in | Wt 268.0 lb

## 2016-07-09 DIAGNOSIS — Z794 Long term (current) use of insulin: Secondary | ICD-10-CM

## 2016-07-09 DIAGNOSIS — E1159 Type 2 diabetes mellitus with other circulatory complications: Secondary | ICD-10-CM | POA: Diagnosis not present

## 2016-07-09 DIAGNOSIS — N521 Erectile dysfunction due to diseases classified elsewhere: Secondary | ICD-10-CM

## 2016-07-09 LAB — POCT GLYCOSYLATED HEMOGLOBIN (HGB A1C): Hemoglobin A1C: 7

## 2016-07-09 MED ORDER — VARDENAFIL HCL 10 MG PO TABS: 10.0000 mg | ORAL_TABLET | Freq: Every day | ORAL | 0 refills | Status: AC | PRN

## 2016-07-09 NOTE — Assessment & Plan Note (Signed)
In office A1c of 7.0 improved from 7.7. Due for a diabetic eye exam encouraged to be completed independently. Diabetic foot exam completed today. All other diabetic screenings are up-to-date. Maintained on rosuvastatin and lisinopril for CAD risk reduction. Pneumovax is up-to-date. Encouraged to monitor blood sugars at home and continue following a decreased carbohydrate intake and increasing physical activity. Continue current dosage of Lantus, Januvia, and metformin. He will check on prices of other insulins and oral medications.

## 2016-07-09 NOTE — Patient Instructions (Addendum)
Thank you for choosing [facility].  SUMMARY AND INSTRUCTIONS:  Excellent work on decreasing your blood sugars. Let's shoot for a goal of < 6.5%  Start Levitra. DO NOT TAKE WITH NITROGLYCERIN  If you have an erection lasting longer than 4 hours seek emergency care.  Medication:  Check the prices of: Basaglar, [place], or Levimir.   Other pills: Onglyza, Tradjenta  Your prescription(s) have been submitted to your pharmacy or been printed and provided for you. Please take as directed and contact our office if you believe you are having problem(s) with the medication(s) or have any questions.  Follow up:  If your symptoms worsen or fail to improve, please contact our office for further instruction, or in case of emergency go directly to the emergency room at the closest medical facility.

## 2016-07-09 NOTE — Assessment & Plan Note (Signed)
New onset erectile dysfunction most likely multifactorial from chronic comorbid conditions as well as medications. Previous treatment failure with Cialis. Start Levitra. Emphasize importance of not taking medication with nitroglycerin to reduce risk of hypotensive events. Advised to seek emergency care for priapism. If symptoms do not improve with medication consider referral to urology for further evaluation and treatment.
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
## 2016-08-05 ENCOUNTER — Telehealth: Payer: Self-pay

## 2016-08-05 NOTE — Telephone Encounter (Signed)
PA for tradjenta initiated.   MBP:[id number]

## 2016-08-18 NOTE — Telephone Encounter (Signed)
PA was approved by insurance.

## 2016-08-25 ENCOUNTER — Telehealth: Payer: Self-pay | Admitting: *Deleted

## 2016-08-25 DIAGNOSIS — F411 Generalized anxiety disorder: Secondary | ICD-10-CM

## 2016-08-25 NOTE — Telephone Encounter (Signed)
Rec'd call from wife requesting refill on husband alprazolam.../lmb

## 2016-08-26 MED ORDER — ALPRAZOLAM 1 MG PO TABS: 1.0000 mg | ORAL_TABLET | Freq: Two times a day (BID) | ORAL | 2 refills | Status: DC | PRN

## 2016-08-26 NOTE — Telephone Encounter (Signed)
Called pt/wife no answer LMOM rx approved and faxed to [facility]...Carl Mccoy

## 2016-08-26 NOTE — Telephone Encounter (Signed)
Medication printed

## 2016-09-03 ENCOUNTER — Other Ambulatory Visit: Payer: Self-pay | Admitting: Family

## 2016-09-03 DIAGNOSIS — E782 Mixed hyperlipidemia: Secondary | ICD-10-CM

## 2016-09-03 DIAGNOSIS — Z794 Long term (current) use of insulin: Principal | ICD-10-CM

## 2016-09-03 DIAGNOSIS — E1159 Type 2 diabetes mellitus with other circulatory complications: Secondary | ICD-10-CM

## 2016-09-27 ENCOUNTER — Other Ambulatory Visit: Payer: Self-pay | Admitting: Family

## 2016-09-27 DIAGNOSIS — E782 Mixed hyperlipidemia: Secondary | ICD-10-CM

## 2016-09-27 DIAGNOSIS — E1159 Type 2 diabetes mellitus with other circulatory complications: Secondary | ICD-10-CM

## 2016-09-27 DIAGNOSIS — Z794 Long term (current) use of insulin: Principal | ICD-10-CM

## 2016-10-01 IMAGING — NM NM MYOCAR MULTI W/SPECT W/WALL MOTION & EF
2 series · 12 of 12 positions shown · non-contrast
Comparison: none

[Series 1: rest · 8.28mm/px · 6 of 64 frames shown]
[frame 6/64]
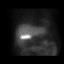
[frame 16/64]
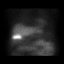
[frame 27/64]
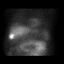
[frame 38/64]
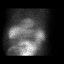
[frame 48/64]
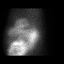
[frame 59/64]
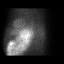

[Series 2: stress gated · 8.28mm/px · 6 of 64 frames shown]
[frame 6/64]
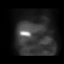
[frame 16/64]
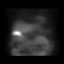
[frame 27/64]
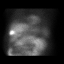
[frame 38/64]
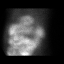
[frame 48/64]
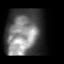
[frame 59/64]
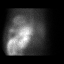

[12 of 12 positions shown; findings below may reference images not displayed]

Canned report from images found in remote index.

Refer to host system for actual result text.

## 2016-10-14 ENCOUNTER — Other Ambulatory Visit: Payer: Self-pay | Admitting: Family

## 2016-10-14 DIAGNOSIS — E1110 Type 2 diabetes mellitus with ketoacidosis without coma: Secondary | ICD-10-CM

## 2016-10-14 DIAGNOSIS — E1159 Type 2 diabetes mellitus with other circulatory complications: Secondary | ICD-10-CM

## 2016-10-14 DIAGNOSIS — Z794 Long term (current) use of insulin: Principal | ICD-10-CM

## 2016-10-22 ENCOUNTER — Encounter: Payer: Self-pay | Admitting: Family

## 2016-10-22 ENCOUNTER — Ambulatory Visit (INDEPENDENT_AMBULATORY_CARE_PROVIDER_SITE_OTHER): Payer: PPO | Admitting: Family

## 2016-10-22 ENCOUNTER — Other Ambulatory Visit: Payer: Self-pay | Admitting: Family

## 2016-10-22 VITALS — BP 138/84 | HR 83 | Temp 97.8°F | Ht 71.0 in | Wt 264.0 lb

## 2016-10-22 DIAGNOSIS — Z794 Long term (current) use of insulin: Secondary | ICD-10-CM | POA: Diagnosis not present

## 2016-10-22 DIAGNOSIS — E1159 Type 2 diabetes mellitus with other circulatory complications: Secondary | ICD-10-CM | POA: Diagnosis not present

## 2016-10-22 DIAGNOSIS — F411 Generalized anxiety disorder: Secondary | ICD-10-CM | POA: Diagnosis not present

## 2016-10-22 LAB — POCT GLYCOSYLATED HEMOGLOBIN (HGB A1C): Hemoglobin A1C: 8.3

## 2016-10-22 MED ORDER — GLUCOSE BLOOD VI STRP: ORAL_STRIP | 12 refills | Status: DC

## 2016-10-22 MED ORDER — METFORMIN HCL 500 MG PO TABS: 1000.0000 mg | ORAL_TABLET | Freq: Two times a day (BID) | ORAL | 2 refills | Status: DC

## 2016-10-22 MED ORDER — ONETOUCH ULTRA MINI W/DEVICE KIT: PACK | 0 refills | Status: DC

## 2016-10-22 MED ORDER — ALPRAZOLAM 1 MG PO TABS: 1.0000 mg | ORAL_TABLET | Freq: Two times a day (BID) | ORAL | 2 refills | Status: DC | PRN
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
## 2016-10-22 NOTE — Assessment & Plan Note (Signed)
Generalized anxiety disorder adequately controlled with current medication regimen and no adverse side effects. [place] Controlled Substance Database reviewed with no irregularities. Continue current dosage of alprazolam.

## 2016-10-22 NOTE — Patient Instructions (Signed)
Thank you for choosing [facility].  SUMMARY AND INSTRUCTIONS:  Please check on the following medications:  Insulins - Alysia Penna, June Leap, Levemir  Weekly injectables: Trulicity, Bydureon  Oral medications: Lovie Macadamia, Invokana.  Increase your metformin to 2000 mg twice daily.  Continue to take the [place] as prescribed.  Medication:  Your prescription(s) have been submitted to your pharmacy or been printed and provided for you. Please take as directed and contact our office if you believe you are having problem(s) with the medication(s) or have any questions.  Follow up:  If your symptoms worsen or fail to improve, please contact our office for further instruction, or in case of emergency go directly to the emergency room at the closest medical facility.

## 2016-10-22 NOTE — Assessment & Plan Note (Signed)
Type 2 diabetes with patient self-medication changes secondary to financial challenges. Increase metformin to 1000 mg twice daily. Continue current dosage of Sitaglipitin. Will check on price of insulin. Consider SGLT-2, GLP-1, or long acting insulin. Patient will check with insurance. Continue to monitor and follow up in 3 months or sooner. If needed.

## 2016-10-25 ENCOUNTER — Telehealth: Payer: Self-pay | Admitting: Family

## 2016-10-25 MED ORDER — EMPAGLIFLOZIN 10 MG PO TABS: 10.0000 mg | ORAL_TABLET | Freq: Every day | ORAL | 2 refills | Status: DC

## 2016-10-25 MED ORDER — INSULIN DEGLUDEC 200 UNIT/ML ~~LOC~~ SOPN: 90.0000 [IU] | PEN_INJECTOR | Freq: Every day | SUBCUTANEOUS | 2 refills | Status: DC

## 2016-10-25 NOTE — Telephone Encounter (Signed)
Pts wife called to let Carl Mccoy know that he would like to switch to [place] Flextouch subcutaneous pen and Jardiance. This is something that is covered through his insurance so it does not cost so much. It should be sent to [facility] in [place].

## 2016-10-25 NOTE — Telephone Encounter (Signed)
Medications sent to pharmacy

## 2016-10-26 NOTE — Telephone Encounter (Signed)
Pt aware.

## 2016-10-31 ENCOUNTER — Other Ambulatory Visit: Payer: Self-pay | Admitting: Family

## 2016-10-31 DIAGNOSIS — E782 Mixed hyperlipidemia: Secondary | ICD-10-CM

## 2016-11-09 ENCOUNTER — Encounter: Payer: Self-pay | Admitting: Cardiology
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
## 2016-11-10 ENCOUNTER — Ambulatory Visit: Payer: PPO | Admitting: Cardiology

## 2016-11-24 ENCOUNTER — Telehealth: Payer: Self-pay | Admitting: Family

## 2016-11-24 ENCOUNTER — Other Ambulatory Visit: Payer: Self-pay | Admitting: Family

## 2016-11-24 NOTE — Telephone Encounter (Signed)
Should have 2 refills remaining.

## 2016-11-24 NOTE — Telephone Encounter (Signed)
ALPRAZolam (XANAX) 1 MG tablet  ° °Patient is requesting a refill on this medication.  °

## 2016-11-25 ENCOUNTER — Other Ambulatory Visit: Payer: Self-pay

## 2016-11-25 MED ORDER — CLOPIDOGREL BISULFATE 75 MG PO TABS: ORAL_TABLET | ORAL | 3 refills | Status: AC

## 2016-11-25 NOTE — Telephone Encounter (Signed)
Notified pt w/Greg response.../lmb 

## 2016-12-03 ENCOUNTER — Emergency Department (HOSPITAL_COMMUNITY)
Admission: EM | Admit: 2016-12-03 | Discharge: 2016-12-03 | Disposition: A | Discharge status: Home / Self Care | Payer: PPO | Attending: Emergency Medicine | Admitting: Emergency Medicine

## 2016-12-03 ENCOUNTER — Encounter (HOSPITAL_COMMUNITY): Payer: Self-pay | Admitting: Emergency Medicine

## 2016-12-03 DIAGNOSIS — E119 Type 2 diabetes mellitus without complications: Secondary | ICD-10-CM | POA: Diagnosis not present

## 2016-12-03 DIAGNOSIS — Z7902 Long term (current) use of antithrombotics/antiplatelets: Secondary | ICD-10-CM | POA: Insufficient documentation

## 2016-12-03 DIAGNOSIS — L237 Allergic contact dermatitis due to plants, except food: Secondary | ICD-10-CM | POA: Insufficient documentation

## 2016-12-03 DIAGNOSIS — Z794 Long term (current) use of insulin: Secondary | ICD-10-CM | POA: Diagnosis not present

## 2016-12-03 DIAGNOSIS — Z79899 Other long term (current) drug therapy: Secondary | ICD-10-CM | POA: Diagnosis not present

## 2016-12-03 DIAGNOSIS — I2510 Atherosclerotic heart disease of native coronary artery without angina pectoris: Secondary | ICD-10-CM | POA: Diagnosis not present

## 2016-12-03 DIAGNOSIS — I10 Essential (primary) hypertension: Secondary | ICD-10-CM | POA: Diagnosis not present

## 2016-12-03 DIAGNOSIS — R21 Rash and other nonspecific skin eruption: Secondary | ICD-10-CM | POA: Diagnosis present

## 2016-12-03 DIAGNOSIS — Z87891 Personal history of nicotine dependence: Secondary | ICD-10-CM | POA: Diagnosis not present

## 2016-12-03 DIAGNOSIS — Z7982 Long term (current) use of aspirin: Secondary | ICD-10-CM | POA: Insufficient documentation

## 2016-12-03 MED ORDER — PREDNISONE 20 MG PO TABS
40.0000 mg | ORAL_TABLET | Freq: Once | ORAL | Status: AC
  Administered 2016-12-03: 40 mg via ORAL
  Filled 2016-12-03: qty 2

## 2016-12-03 MED ORDER — PREDNISONE 20 MG PO TABS: ORAL_TABLET | ORAL | 0 refills | Status: DC
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
## 2016-12-03 NOTE — ED Triage Notes (Signed)
Pt C/O itching all over after cutting up a tree.

## 2016-12-03 NOTE — Discharge Instructions (Signed)
Take loratadine (Claritin) or cetirizine (Zyrtec) once a day. Take diphenhydramine (Benadryl) every four hours as needed for itching not controlled by loratadine or cetirizine.  Watch your blood sugar carefully while taking prednisone - you may need to increase your insulin dose because prednisone will raise your blood sugar.

## 2016-12-03 NOTE — ED Notes (Signed)
Pt ambulatory to waiting room. Pt verbalized understanding of discharge instructions.   

## 2016-12-16 ENCOUNTER — Telehealth: Payer: Self-pay | Admitting: Family

## 2016-12-16 MED ORDER — INSULIN GLARGINE 100 UNIT/ML SOLOSTAR PEN: 90.0000 [IU] | PEN_INJECTOR | Freq: Every day | SUBCUTANEOUS | 2 refills | Status: DC

## 2016-12-16 NOTE — Telephone Encounter (Signed)
Notified pt w/greg response...[name]

## 2016-12-16 NOTE — Telephone Encounter (Signed)
Tresiba discontinued and Lantus sent to pharmacy.

## 2016-12-16 NOTE — Telephone Encounter (Signed)
Called pt verified what dosage he is taking w/the [place]. Pt states he takes 90 units twice a day. Inform him he is needing updated script that why its not lasting a month. Pt states he rather go back on the Lantus because the [place] cost him $147 dollars a month, and if he has to double that he can't afford. inform pt will send msg to [facility] and give him a call back w/his recommendations...Carl Mccoy

## 2016-12-16 NOTE — Telephone Encounter (Signed)
Pts wife called stating that the [facility] pens are not lasting a whole month. She thinks that he may need to switch back to the lantus pens because they would last the full 30 days.

## 2016-12-20 ENCOUNTER — Other Ambulatory Visit: Payer: Self-pay | Admitting: Family

## 2016-12-20 DIAGNOSIS — I10 Essential (primary) hypertension: Secondary | ICD-10-CM

## 2017-01-06 ENCOUNTER — Telehealth: Payer: Self-pay | Admitting: Family

## 2017-01-06 MED ORDER — INSULIN PEN NEEDLE 29G X 12.7MM MISC: 3 refills | Status: AC

## 2017-01-06 NOTE — Telephone Encounter (Signed)
Pt called stating that he is needing a prescription for BP Ultra Fine Needles for his insulin pens. He was not able to give himself an injection last night because he did not have the needles. He would like these sent in as soon as possible to [facility] in [place].

## 2017-01-06 NOTE — Telephone Encounter (Signed)
rx for pen needles sent...Carl Mccoy

## 2017-01-11 ENCOUNTER — Other Ambulatory Visit: Payer: Self-pay | Admitting: Family

## 2017-01-11 DIAGNOSIS — E1159 Type 2 diabetes mellitus with other circulatory complications: Secondary | ICD-10-CM

## 2017-01-11 DIAGNOSIS — Z794 Long term (current) use of insulin: Principal | ICD-10-CM

## 2017-01-14 ENCOUNTER — Other Ambulatory Visit: Payer: Self-pay | Admitting: Cardiology

## 2017-01-14 ENCOUNTER — Encounter: Payer: Self-pay | Admitting: Cardiology

## 2017-01-14 ENCOUNTER — Ambulatory Visit (INDEPENDENT_AMBULATORY_CARE_PROVIDER_SITE_OTHER): Payer: PPO | Admitting: Cardiology

## 2017-01-14 VITALS — BP 150/84 | HR 90 | Ht 71.0 in | Wt 264.0 lb

## 2017-01-14 DIAGNOSIS — I200 Unstable angina: Secondary | ICD-10-CM

## 2017-01-14 DIAGNOSIS — Z01818 Encounter for other preprocedural examination: Secondary | ICD-10-CM | POA: Diagnosis not present

## 2017-01-14 DIAGNOSIS — E782 Mixed hyperlipidemia: Secondary | ICD-10-CM

## 2017-01-14 DIAGNOSIS — I10 Essential (primary) hypertension: Secondary | ICD-10-CM | POA: Diagnosis not present

## 2017-01-14 DIAGNOSIS — I25119 Atherosclerotic heart disease of native coronary artery with unspecified angina pectoris: Secondary | ICD-10-CM | POA: Diagnosis not present

## 2017-01-14 MED ORDER — NITROGLYCERIN 0.4 MG SL SUBL: 0.4000 mg | SUBLINGUAL_TABLET | SUBLINGUAL | 2 refills | Status: AC | PRN

## 2017-01-14 NOTE — Patient Instructions (Signed)
   [place] [facility] [place] [address] Dept: [phone] Loc: [phone]  KENNETH CUARESMA  01/14/2017  You are scheduled for a Cardiac Catheterization on Friday, September 21 with Dr. Lauree Chandler.  1. Please arrive at the [facility] (Main Entrance A) at [facility]: [address] [place], [place] [address] at 11:30 AM (two hours before your procedure to ensure your preparation). Free valet parking service is available.   Special note: Every effort is made to have your procedure done on time. Please understand that emergencies sometimes delay scheduled procedures.  2. Diet: Do not eat or drink anything after midnight prior to your procedure except sips of water to take medications.  3. Labs: get lab work at [facility] on Monday 01/17/17  4. Medication instructions in preparation for your procedure:     HOLD LISINOPRIL-HYDROCHLOROTHIAZIDE MORNING OF CATH   *For reference purposes while preparing patient instructions.   Delete this med list prior to printing instructions for patient.*    Stop taking, Glucophage (Metformin) Thursday 9/20, HOLD THE DAY OF CATH AND FOR TWO DAYS AFTER    Take only 40 units of insulin the night before your procedure. Do not take any insulin on the day of the procedure.  On the morning of your procedure, take your Aspirin and any morning medicines NOT listed above.  You may use sips of water.  5. Plan for one night stay--bring personal belongings. 6. Bring a current list of your medications and current insurance cards. 7. You MUST have a responsible person to drive you home. 8. Someone MUST be with you the first 24 hours after you arrive home or your discharge will be delayed. 9. Please wear clothes that are easy to get on and off and wear slip-on shoes.  Thank you for allowing [place] to care for you!   -- [place] [facility]
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
## 2017-01-17 ENCOUNTER — Other Ambulatory Visit (HOSPITAL_COMMUNITY)
Admission: RE | Admit: 2017-01-17 | Discharge: 2017-01-17 | Disposition: A | Discharge status: Home / Self Care | Payer: PPO | Source: Ambulatory Visit | Attending: Cardiology | Admitting: Cardiology

## 2017-01-17 DIAGNOSIS — I25119 Atherosclerotic heart disease of native coronary artery with unspecified angina pectoris: Secondary | ICD-10-CM | POA: Insufficient documentation

## 2017-01-17 DIAGNOSIS — Z01818 Encounter for other preprocedural examination: Secondary | ICD-10-CM | POA: Insufficient documentation

## 2017-01-17 LAB — PROTIME-INR
INR: 0.9
Prothrombin Time: 12.1 seconds (ref 11.4–15.2)

## 2017-01-17 LAB — CBC WITH DIFFERENTIAL/PLATELET
Basophils Absolute: 0 10*3/uL (ref 0.0–0.1)
Basophils Relative: 0 %
EOS ABS: 0.2 10*3/uL (ref 0.0–0.7)
Eosinophils Relative: 3 %
HEMATOCRIT: 40.7 % (ref 39.0–52.0)
HEMOGLOBIN: 13.4 g/dL (ref 13.0–17.0)
Lymphocytes Relative: 27 %
Lymphs Abs: 1.5 10*3/uL (ref 0.7–4.0)
MCH: 28.9 pg (ref 26.0–34.0)
MCHC: 32.9 g/dL (ref 30.0–36.0)
MCV: 87.7 fL (ref 78.0–100.0)
Monocytes Absolute: 0.4 10*3/uL (ref 0.1–1.0)
Monocytes Relative: 6 %
NEUTROS ABS: 3.6 10*3/uL (ref 1.7–7.7)
NEUTROS PCT: 64 %
Platelets: 198 10*3/uL (ref 150–400)
RBC: 4.64 MIL/uL (ref 4.22–5.81)
RDW: 13.5 % (ref 11.5–15.5)
WBC: 5.6 10*3/uL (ref 4.0–10.5)

## 2017-01-17 LAB — BASIC METABOLIC PANEL
Anion gap: 11 (ref 5–15)
BUN: 20 mg/dL (ref 6–20)
CHLORIDE: 98 mmol/L — AB (ref 101–111)
CO2: 27 mmol/L (ref 22–32)
Calcium: 9.1 mg/dL (ref 8.9–10.3)
Creatinine, Ser: 0.9 mg/dL (ref 0.61–1.24)
GFR calc Af Amer: >60 mL/min (ref >60)
GFR calc non Af Amer: >60 mL/min (ref >60)
Glucose, Bld: 269 mg/dL — ABNORMAL HIGH (ref 65–99)
POTASSIUM: 3.6 mmol/L (ref 3.5–5.1)
SODIUM: 136 mmol/L (ref 135–145)

## 2017-01-20 ENCOUNTER — Telehealth: Payer: Self-pay

## 2017-01-20 NOTE — Telephone Encounter (Signed)
Patient contacted pre-catheterization at [facility] scheduled for:  01/21/2017 @ 1330 Verified arrival time and place:  NT @ 1130 Confirmed AM meds to be taken pre-cath with sip of water: Take ASA/Plavix Hold jardiance, lisinopril/hctz Metformin-Pt took am dose 01/20/2017-notified to not take anymore  Lantus- take half dose evening prior to procedure

## 2017-01-21 ENCOUNTER — Encounter (HOSPITAL_COMMUNITY)
Admission: RE | Disposition: A | Discharge status: Home / Self Care | Payer: Self-pay | Source: Ambulatory Visit | Attending: Cardiovascular Disease

## 2017-01-21 ENCOUNTER — Ambulatory Visit (HOSPITAL_COMMUNITY)
Admission: RE | Admit: 2017-01-21 | Discharge: 2017-01-22 | Disposition: A | Discharge status: Home / Self Care | Payer: PPO | Source: Ambulatory Visit | Attending: Cardiovascular Disease | Admitting: Cardiovascular Disease

## 2017-01-21 DIAGNOSIS — Z794 Long term (current) use of insulin: Secondary | ICD-10-CM | POA: Diagnosis not present

## 2017-01-21 DIAGNOSIS — Z79899 Other long term (current) drug therapy: Secondary | ICD-10-CM | POA: Insufficient documentation

## 2017-01-21 DIAGNOSIS — E785 Hyperlipidemia, unspecified: Secondary | ICD-10-CM | POA: Insufficient documentation

## 2017-01-21 DIAGNOSIS — Z8249 Family history of ischemic heart disease and other diseases of the circulatory system: Secondary | ICD-10-CM | POA: Diagnosis not present

## 2017-01-21 DIAGNOSIS — K219 Gastro-esophageal reflux disease without esophagitis: Secondary | ICD-10-CM | POA: Diagnosis not present

## 2017-01-21 DIAGNOSIS — Z7902 Long term (current) use of antithrombotics/antiplatelets: Secondary | ICD-10-CM | POA: Diagnosis not present

## 2017-01-21 DIAGNOSIS — E119 Type 2 diabetes mellitus without complications: Secondary | ICD-10-CM | POA: Insufficient documentation

## 2017-01-21 DIAGNOSIS — Z9989 Dependence on other enabling machines and devices: Secondary | ICD-10-CM | POA: Diagnosis not present

## 2017-01-21 DIAGNOSIS — Z6836 Body mass index (BMI) 36.0-36.9, adult: Secondary | ICD-10-CM | POA: Diagnosis not present

## 2017-01-21 DIAGNOSIS — M1990 Unspecified osteoarthritis, unspecified site: Secondary | ICD-10-CM | POA: Diagnosis not present

## 2017-01-21 DIAGNOSIS — Z955 Presence of coronary angioplasty implant and graft: Secondary | ICD-10-CM | POA: Diagnosis not present

## 2017-01-21 DIAGNOSIS — Z7982 Long term (current) use of aspirin: Secondary | ICD-10-CM | POA: Insufficient documentation

## 2017-01-21 DIAGNOSIS — G4733 Obstructive sleep apnea (adult) (pediatric): Secondary | ICD-10-CM | POA: Diagnosis not present

## 2017-01-21 DIAGNOSIS — Z89511 Acquired absence of right leg below knee: Secondary | ICD-10-CM | POA: Diagnosis not present

## 2017-01-21 DIAGNOSIS — Z888 Allergy status to other drugs, medicaments and biological substances status: Secondary | ICD-10-CM | POA: Diagnosis not present

## 2017-01-21 DIAGNOSIS — Z87891 Personal history of nicotine dependence: Secondary | ICD-10-CM | POA: Diagnosis not present

## 2017-01-21 DIAGNOSIS — I25110 Atherosclerotic heart disease of native coronary artery with unstable angina pectoris: Secondary | ICD-10-CM

## 2017-01-21 DIAGNOSIS — Z96652 Presence of left artificial knee joint: Secondary | ICD-10-CM | POA: Diagnosis not present

## 2017-01-21 DIAGNOSIS — I119 Hypertensive heart disease without heart failure: Secondary | ICD-10-CM | POA: Diagnosis not present

## 2017-01-21 DIAGNOSIS — I200 Unstable angina: Secondary | ICD-10-CM | POA: Diagnosis present

## 2017-01-21 HISTORY — PX: LEFT HEART CATH AND CORONARY ANGIOGRAPHY: CATH118249

## 2017-01-21 HISTORY — PX: CORONARY STENT INTERVENTION: CATH118234

## 2017-01-21 LAB — POCT ACTIVATED CLOTTING TIME
Activated Clotting Time: 208 s
Activated Clotting Time: 483 s

## 2017-01-21 LAB — GLUCOSE, CAPILLARY
GLUCOSE-CAPILLARY: 190 mg/dL — AB (ref 65–99)
GLUCOSE-CAPILLARY: 266 mg/dL — AB (ref 65–99)
Glucose-Capillary: 145 mg/dL — ABNORMAL HIGH (ref 65–99)
Glucose-Capillary: 195 mg/dL — ABNORMAL HIGH (ref 65–99)

## 2017-01-21 SURGERY — LEFT HEART CATH AND CORONARY ANGIOGRAPHY
Anesthesia: LOCAL

## 2017-01-21 MED ORDER — CLOPIDOGREL BISULFATE 300 MG PO TABS
ORAL_TABLET | ORAL | Status: DC | PRN
  Administered 2017-01-21: 300 mg via ORAL

## 2017-01-21 MED ORDER — ONDANSETRON HCL 4 MG/2ML IJ SOLN: 4.0000 mg | Freq: Four times a day (QID) | INTRAMUSCULAR | Status: DC | PRN

## 2017-01-21 MED ORDER — IOPAMIDOL (ISOVUE-370) INJECTION 76%
INTRAVENOUS | Status: DC | PRN
  Administered 2017-01-21: 115 mL via INTRA_ARTERIAL

## 2017-01-21 MED ORDER — MIDAZOLAM HCL 2 MG/2ML IJ SOLN
INTRAMUSCULAR | Status: AC
  Filled 2017-01-21: qty 2

## 2017-01-21 MED ORDER — SODIUM CHLORIDE 0.9 % IV SOLN
250.0000 mL | INTRAVENOUS | Status: DC | PRN
Start: 2017-01-21 — End: 2017-01-21

## 2017-01-21 MED ORDER — ACETAMINOPHEN 325 MG PO TABS: 650.0000 mg | ORAL_TABLET | ORAL | Status: DC | PRN

## 2017-01-21 MED ORDER — LIDOCAINE HCL 2 % IJ SOLN
INTRAMUSCULAR | Status: AC
  Filled 2017-01-21: qty 10

## 2017-01-21 MED ORDER — ASPIRIN 81 MG PO CHEW: 81.0000 mg | CHEWABLE_TABLET | ORAL | Status: DC

## 2017-01-21 MED ORDER — SODIUM CHLORIDE 0.9% FLUSH: 3.0000 mL | Freq: Two times a day (BID) | INTRAVENOUS | Status: DC

## 2017-01-21 MED ORDER — PANTOPRAZOLE SODIUM 40 MG PO TBEC
40.0000 mg | DELAYED_RELEASE_TABLET | Freq: Every day | ORAL | Status: DC
  Administered 2017-01-22: 40 mg via ORAL
  Filled 2017-01-21: qty 1

## 2017-01-21 MED ORDER — HEPARIN SODIUM (PORCINE) 1000 UNIT/ML IJ SOLN
INTRAMUSCULAR | Status: AC
  Filled 2017-01-21: qty 2

## 2017-01-21 MED ORDER — HYDRALAZINE HCL 20 MG/ML IJ SOLN: 5.0000 mg | INTRAMUSCULAR | Status: AC | PRN

## 2017-01-21 MED ORDER — CLOPIDOGREL BISULFATE 75 MG PO TABS
75.0000 mg | ORAL_TABLET | Freq: Every day | ORAL | Status: DC
  Administered 2017-01-22: 75 mg via ORAL
  Filled 2017-01-21: qty 1

## 2017-01-21 MED ORDER — LIDOCAINE HCL (PF) 1 % IJ SOLN
INTRAMUSCULAR | Status: DC | PRN
  Administered 2017-01-21: 2 mL

## 2017-01-21 MED ORDER — ROSUVASTATIN CALCIUM 5 MG PO TABS
5.0000 mg | ORAL_TABLET | Freq: Every day | ORAL | Status: DC
  Administered 2017-01-22: 5 mg via ORAL
  Filled 2017-01-21: qty 1

## 2017-01-21 MED ORDER — ANGIOPLASTY BOOK
Freq: Once | Status: AC
  Administered 2017-01-21: 20:00:00
  Filled 2017-01-21: qty 1

## 2017-01-21 MED ORDER — FENTANYL CITRATE (PF) 100 MCG/2ML IJ SOLN
INTRAMUSCULAR | Status: DC | PRN
  Administered 2017-01-21: 50 ug via INTRAVENOUS

## 2017-01-21 MED ORDER — CLOPIDOGREL BISULFATE 300 MG PO TABS
ORAL_TABLET | ORAL | Status: AC
  Filled 2017-01-21: qty 1

## 2017-01-21 MED ORDER — SODIUM CHLORIDE 0.9 % WEIGHT BASED INFUSION
3.0000 mL/kg/h | INTRAVENOUS | Status: DC
  Administered 2017-01-21: 3 mL/kg/h via INTRAVENOUS

## 2017-01-21 MED ORDER — NITROGLYCERIN 0.4 MG SL SUBL: 0.4000 mg | SUBLINGUAL_TABLET | SUBLINGUAL | Status: DC | PRN

## 2017-01-21 MED ORDER — FENOFIBRATE 160 MG PO TABS
160.0000 mg | ORAL_TABLET | Freq: Every day | ORAL | Status: DC
  Administered 2017-01-22: 160 mg via ORAL
  Filled 2017-01-21: qty 1

## 2017-01-21 MED ORDER — VERAPAMIL HCL 2.5 MG/ML IV SOLN
INTRAVENOUS | Status: DC | PRN
  Administered 2017-01-21: 10 mL via INTRA_ARTERIAL

## 2017-01-21 MED ORDER — LISINOPRIL 20 MG PO TABS
20.0000 mg | ORAL_TABLET | Freq: Every day | ORAL | Status: DC
  Administered 2017-01-22: 20 mg via ORAL
  Filled 2017-01-21: qty 1

## 2017-01-21 MED ORDER — MIDAZOLAM HCL 2 MG/2ML IJ SOLN
INTRAMUSCULAR | Status: DC | PRN
  Administered 2017-01-21: 2 mg via INTRAVENOUS

## 2017-01-21 MED ORDER — VERAPAMIL HCL 2.5 MG/ML IV SOLN
INTRAVENOUS | Status: AC
  Filled 2017-01-21: qty 2

## 2017-01-21 MED ORDER — SODIUM CHLORIDE 0.9 % WEIGHT BASED INFUSION: 1.0000 mL/kg/h | INTRAVENOUS | Status: DC

## 2017-01-21 MED ORDER — IOPAMIDOL (ISOVUE-370) INJECTION 76%
INTRAVENOUS | Status: AC
  Filled 2017-01-21: qty 100

## 2017-01-21 MED ORDER — LABETALOL HCL 5 MG/ML IV SOLN: 10.0000 mg | INTRAVENOUS | Status: AC | PRN

## 2017-01-21 MED ORDER — HEPARIN (PORCINE) IN NACL 2-0.9 UNIT/ML-% IJ SOLN
INTRAMUSCULAR | Status: AC | PRN
  Administered 2017-01-21: 1000 mL
  Administered 2017-01-21: 500 mL

## 2017-01-21 MED ORDER — SODIUM CHLORIDE 0.9 % IV SOLN: INTRAVENOUS | Status: AC

## 2017-01-21 MED ORDER — HYDROCHLOROTHIAZIDE 25 MG PO TABS
25.0000 mg | ORAL_TABLET | Freq: Every day | ORAL | Status: DC
  Administered 2017-01-22: 25 mg via ORAL
  Filled 2017-01-21: qty 1

## 2017-01-21 MED ORDER — INSULIN GLARGINE 100 UNIT/ML SOLOSTAR PEN: 90.0000 [IU] | PEN_INJECTOR | Freq: Every day | SUBCUTANEOUS | Status: DC

## 2017-01-21 MED ORDER — FENTANYL CITRATE (PF) 100 MCG/2ML IJ SOLN
INTRAMUSCULAR | Status: AC
  Filled 2017-01-21: qty 2

## 2017-01-21 MED ORDER — HEPARIN SODIUM (PORCINE) 1000 UNIT/ML IJ SOLN
INTRAMUSCULAR | Status: DC | PRN
  Administered 2017-01-21: 3000 [IU] via INTRAVENOUS
  Administered 2017-01-21: 6000 [IU] via INTRAVENOUS
  Administered 2017-01-21: 5000 [IU] via INTRAVENOUS

## 2017-01-21 MED ORDER — LISINOPRIL-HYDROCHLOROTHIAZIDE 20-25 MG PO TABS: 1.0000 | ORAL_TABLET | Freq: Every day | ORAL | Status: DC

## 2017-01-21 MED ORDER — SODIUM CHLORIDE 0.9 % IV SOLN: 250.0000 mL | INTRAVENOUS | Status: DC | PRN

## 2017-01-21 MED ORDER — INSULIN GLARGINE 100 UNIT/ML ~~LOC~~ SOLN
90.0000 [IU] | Freq: Every day | SUBCUTANEOUS | Status: DC
  Administered 2017-01-21: 90 [IU] via SUBCUTANEOUS
  Filled 2017-01-21 (×2): qty 0.9

## 2017-01-21 MED ORDER — SODIUM CHLORIDE 0.9% FLUSH
3.0000 mL | INTRAVENOUS | Status: DC | PRN
Start: 2017-01-21 — End: 2017-01-21

## 2017-01-21 MED ORDER — HEPARIN (PORCINE) IN NACL 2-0.9 UNIT/ML-% IJ SOLN
INTRAMUSCULAR | Status: AC
  Filled 2017-01-21: qty 1000

## 2017-01-21 MED ORDER — SODIUM CHLORIDE 0.9% FLUSH: 3.0000 mL | INTRAVENOUS | Status: DC | PRN

## 2017-01-21 MED ORDER — ASPIRIN EC 81 MG PO TBEC
81.0000 mg | DELAYED_RELEASE_TABLET | Freq: Every day | ORAL | Status: DC
  Administered 2017-01-22: 81 mg via ORAL
  Filled 2017-01-21: qty 1

## 2017-01-21 SURGICAL SUPPLY — 25 items
BALLN SAPPHIRE 2.0X12 (BALLOONS) ×2
BALLN SAPPHIRE ~~LOC~~ 3.0X18 (BALLOONS) ×1 IMPLANT
BALLOON SAPPHIRE 2.0X12 (BALLOONS) IMPLANT
CATH EXPO 5F FL3.5 (CATHETERS) ×1 IMPLANT
CATH GUIDEZILLA II 6F (CATHETERS) IMPLANT
CATH INFINITI 5 FR 3DRC (CATHETERS) ×1 IMPLANT
CATH INFINITI 5FR AL1 (CATHETERS) ×1 IMPLANT
CATH INFINITI JR4 5F (CATHETERS) ×1 IMPLANT
CATH LAUNCHER 6FR AL1 (CATHETERS) IMPLANT
CATH MICROGUIDE FINCRSS 150 CM (MICROCATHETER) IMPLANT
CATHETER GUIDEZILLA II 6F (CATHETERS) ×2
CATHETER LAUNCHER 6FR AL1 (CATHETERS) ×2
DEVICE RAD COMP TR BAND LRG (VASCULAR PRODUCTS) ×1 IMPLANT
GLIDESHEATH SLEND SS 6F .021 (SHEATH) ×1 IMPLANT
GUIDEWIRE INQWIRE 1.5J.035X260 (WIRE) IMPLANT
INQWIRE 1.5J .035X260CM (WIRE) ×2
KIT ENCORE 26 ADVANTAGE (KITS) ×1 IMPLANT
KIT HEART LEFT (KITS) ×2 IMPLANT
MICROGUIDE FINECROSS 150 CM (MICROCATHETER) ×2
PACK CARDIAC CATHETERIZATION (CUSTOM PROCEDURE TRAY) ×2 IMPLANT
STENT RESOLUTE ONYX 2.75X26 (Permanent Stent) ×1 IMPLANT
TRANSDUCER W/STOPCOCK (MISCELLANEOUS) ×3 IMPLANT
TUBING CIL FLEX 10 FLL-RA (TUBING) ×2 IMPLANT
WIRE COUGAR XT STRL 300CM (WIRE) ×1 IMPLANT
WIRE MAILMAN 300CM (WIRE) ×1 IMPLANT
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
## 2017-01-21 NOTE — Interval H&P Note (Signed)
History and Physical Interval Note:  01/21/2017 12:39 PM  Carl Mccoy  has presented today for cardiac cath with the diagnosis of unstable angina  The various methods of treatment have been discussed with the patient and family. After consideration of risks, benefits and other options for treatment, the patient has consented to  Procedure(s): LEFT HEART CATH AND CORONARY ANGIOGRAPHY (N/A) as a surgical intervention .  The patient's history has been reviewed, patient examined, no change in status, stable for surgery.  I have reviewed the patient's chart and labs.  Questions were answered to the patient's satisfaction.    Cath Lab Visit (complete for each Cath Lab visit)  Clinical Evaluation Leading to the Procedure:   ACS: No.  Non-ACS:    Anginal Classification: CCS III  Anti-ischemic medical therapy: No Therapy  Non-Invasive Test Results: No non-invasive testing performed  Prior CABG: No previous CABG         Lauree Chandler

## 2017-01-22 ENCOUNTER — Encounter (HOSPITAL_COMMUNITY): Payer: Self-pay | Admitting: Cardiology

## 2017-01-22 DIAGNOSIS — Z955 Presence of coronary angioplasty implant and graft: Secondary | ICD-10-CM | POA: Diagnosis not present

## 2017-01-22 DIAGNOSIS — K219 Gastro-esophageal reflux disease without esophagitis: Secondary | ICD-10-CM | POA: Diagnosis not present

## 2017-01-22 DIAGNOSIS — I200 Unstable angina: Secondary | ICD-10-CM | POA: Diagnosis not present

## 2017-01-22 DIAGNOSIS — Z96652 Presence of left artificial knee joint: Secondary | ICD-10-CM | POA: Diagnosis not present

## 2017-01-22 DIAGNOSIS — G4733 Obstructive sleep apnea (adult) (pediatric): Secondary | ICD-10-CM | POA: Diagnosis not present

## 2017-01-22 DIAGNOSIS — I119 Hypertensive heart disease without heart failure: Secondary | ICD-10-CM | POA: Diagnosis not present

## 2017-01-22 DIAGNOSIS — I25110 Atherosclerotic heart disease of native coronary artery with unstable angina pectoris: Secondary | ICD-10-CM | POA: Diagnosis not present

## 2017-01-22 DIAGNOSIS — Z6836 Body mass index (BMI) 36.0-36.9, adult: Secondary | ICD-10-CM | POA: Diagnosis not present

## 2017-01-22 DIAGNOSIS — M1990 Unspecified osteoarthritis, unspecified site: Secondary | ICD-10-CM | POA: Diagnosis not present

## 2017-01-22 DIAGNOSIS — E785 Hyperlipidemia, unspecified: Secondary | ICD-10-CM | POA: Diagnosis not present

## 2017-01-22 DIAGNOSIS — Z7982 Long term (current) use of aspirin: Secondary | ICD-10-CM | POA: Diagnosis not present

## 2017-01-22 DIAGNOSIS — E119 Type 2 diabetes mellitus without complications: Secondary | ICD-10-CM | POA: Diagnosis not present

## 2017-01-22 LAB — BASIC METABOLIC PANEL
Anion gap: 6 (ref 5–15)
BUN: 22 mg/dL — AB (ref 6–20)
CHLORIDE: 104 mmol/L (ref 101–111)
CO2: 26 mmol/L (ref 22–32)
CREATININE: 0.99 mg/dL (ref 0.61–1.24)
Calcium: 8.6 mg/dL — ABNORMAL LOW (ref 8.9–10.3)
GFR calc Af Amer: >60 mL/min (ref >60)
GFR calc non Af Amer: >60 mL/min (ref >60)
Glucose, Bld: 283 mg/dL — ABNORMAL HIGH (ref 65–99)
Potassium: 3.8 mmol/L (ref 3.5–5.1)
Sodium: 136 mmol/L (ref 135–145)

## 2017-01-22 LAB — CBC
HEMATOCRIT: 38.4 % — AB (ref 39.0–52.0)
Hemoglobin: 12.3 g/dL — ABNORMAL LOW (ref 13.0–17.0)
MCH: 28.1 pg (ref 26.0–34.0)
MCHC: 32 g/dL (ref 30.0–36.0)
MCV: 87.7 fL (ref 78.0–100.0)
PLATELETS: 178 10*3/uL (ref 150–400)
RBC: 4.38 MIL/uL (ref 4.22–5.81)
RDW: 13.7 % (ref 11.5–15.5)
WBC: 5.6 10*3/uL (ref 4.0–10.5)

## 2017-01-22 LAB — GLUCOSE, CAPILLARY: Glucose-Capillary: 222 mg/dL — ABNORMAL HIGH (ref 65–99)

## 2017-01-22 MED ORDER — INSULIN ASPART 100 UNIT/ML ~~LOC~~ SOLN
0.0000 [IU] | Freq: Three times a day (TID) | SUBCUTANEOUS | Status: DC
  Administered 2017-01-22: 5 [IU] via SUBCUTANEOUS

## 2017-01-22 NOTE — Discharge Summary (Signed)
Discharge Summary    Patient ID: Carl Mccoy,  MRN: [id number], DOB/AGE: Jul 08, 1955 61 y.o.  Admit date: 01/21/2017 Discharge date: 01/22/2017  Primary Care Provider: [facility] Primary Cardiologist: Dr. Domenic Polite  Discharge Diagnoses    Active Problems:   Unstable angina [facility])   Allergies Allergies  Allergen Reactions  . Brilinta [Ticagrelor] Shortness Of Breath    Diagnostic Studies/Procedures    CORONARY STENT INTERVENTION  LEFT HEART CATH AND CORONARY ANGIOGRAPHY    01/21/17  Conclusion     Ost RPDA lesion, 80 %stenosed.  Ost RCA to Prox RCA lesion, 20 %stenosed.  Ost LAD to Prox LAD lesion, 0 %stenosed.  Ost 1st Diag to 1st Diag lesion, 40 %stenosed.  Mid RCA-1 lesion, 99 %stenosed.  Dist RCA-2 lesion, 90 %stenosed.  Dist RCA-1 lesion, 20 %stenosed.  2nd Mrg lesion, 40 %stenosed.  Mid LAD to Dist LAD lesion, 20 %stenosed.  A STENT RESOLUTE ONYX 2.75X26 drug eluting stent was successfully placed.  Mid RCA-2 lesion, 0 %stenosed.  A drug eluting .  Post intervention, there is a 0% residual stenosis.   1. Double vessel CAD 2. Severe restenosis mid RCA stented segment. This vessel has 2 layers of stent in the mid RCA where there is severe restenosis. (Promus and Synergy-both everolimus coated stents). Successful PTCA/DES x 1 mid RCA covering the old stent. A Resolute stent was placed.  3. Severe stenosis small PDA, unchanged from last cath.  4. Severe stenosis PLA. This vessel fills from left to right collaterals.   Recommendations: Continue DAPT with ASA and Plavix. Attempt to maximize cardiac medications.     _____________   History of Present Illness     Carl Mccoy is a 61 y.o. male With past medical history of CAD  (multiple interventions ), hypertension and hyperlipidemia last seen in December 2017. At that time he was referred for a cardiac catheterization in light of accelerating angina symptoms. Procedure was performed by  Dr. Burt Knack and revealed severe in-stent restenosis within the mid RCA which was treated with DES. LAD stent site was patent and there was no significant disease in the circumflex.  He presented to the office on 01/14/17 for follow-up with Dr. Domenic Polite. Unfortunately, over the last 1 to 2 months he has been having recurring and progressive angina as well as dyspnea on exertion despite medical therapy. He states the symptoms are identical to what he felt back in December of last year. In going over his medications carefully, it turns out he has not been taking aspirin with his Plavix. He was planned to have a cardiac cath the following week.   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   Hospital Course     Consultants: None   The patient presented for Cardiac catheterization on 01/21/2017 and evaluation of unstable angina. He was found to have severe restenosis of the mid RCA stented segment. This vessel has 2 layers of stent in the mid RCA where there is severe restenosis. He had successful PTCA and drug-eluting stent in the mid RCA covering the old stent. A resolute stent was used. He also has severe stenosis of a small PDA, unchanged from last cath and severe stenosis of the PLA with left-to-right collaterals. The plan is to continue dual antiplatelet therapy with aspirin and Plavix. We'll attempt to maximize cardiac medications. His blood pressure was elevated. He will need to be followed up as an outpatient. He is treated with a statin.  The patient has done well post catheterization. He has been up walking around with no problems. Serum creatinine 0.99 on discharge.  Patient has been seen by Dr. Harrington Challenger today and deemed ready for discharge home. All follow up appointments have been scheduled. Discharge medications are listed below. _____________  Discharge Vitals Blood pressure (!) 164/98, pulse 77, temperature 97.8 F (36.6 C), temperature source Oral, resp. rate 16, height 5' 11"   (1.803 m), weight 260 lb 2.3 oz (118 kg), SpO2 97 %.  Filed Weights   01/21/17 1103 01/22/17 0355  Weight: 264 lb (119.7 kg) 260 lb 2.3 oz (118 kg)   Labs & Radiologic Studies    CBC  Recent Labs  01/22/17 0158  WBC 5.6  HGB 12.3*  HCT 38.4*  MCV 87.7  PLT 315   Basic Metabolic Panel  Recent Labs  01/22/17 0158  NA 136  K 3.8  CL 104  CO2 26  GLUCOSE 283*  BUN 22*  CREATININE 0.99  CALCIUM 8.6*   Liver Function Tests No results for input(s): AST, ALT, ALKPHOS, BILITOT, PROT, ALBUMIN in the last 72 hours. No results for input(s): LIPASE, AMYLASE in the last 72 hours. Cardiac Enzymes No results for input(s): CKTOTAL, CKMB, CKMBINDEX, TROPONINI in the last 72 hours. BNP Invalid input(s): POCBNP D-Dimer No results for input(s): DDIMER in the last 72 hours. Hemoglobin A1C No results for input(s): HGBA1C in the last 72 hours. Fasting Lipid Panel No results for input(s): CHOL, HDL, LDLCALC, TRIG, CHOLHDL, LDLDIRECT in the last 72 hours. Thyroid Function Tests No results for input(s): TSH, T4TOTAL, T3FREE, THYROIDAB in the last 72 hours.  Invalid input(s): FREET3 _____________  No results found. Disposition   Pt is being discharged home today in good condition.  Follow-up Plans & Appointments    Follow-up Information    Satira Sark, MD Follow up.   Specialty:  Cardiology Why:  The office will call you to schedule a hospital follow up.  Contact information: [place] [address] [phone]          Discharge Instructions    Diet - low sodium heart healthy    Complete by:  As directed    Discharge instructions    Complete by:  As directed    PLEASE REMEMBER TO BRING ALL OF YOUR MEDICATIONS TO EACH OF YOUR FOLLOW-UP OFFICE VISITS.  PLEASE ATTEND ALL SCHEDULED FOLLOW-UP APPOINTMENTS.   Activity: Increase activity slowly as tolerated. You may shower, but no soaking baths (or swimming) for 1 week. No driving for 24 hours. No  lifting over 5 lbs for 1 week. No sexual activity for 1 week.   You May Return to Work: in 1 week (if applicable)  Wound Care: You may wash cath site gently with soap and water. Keep cath site clean and dry. If you notice pain, swelling, bleeding or pus at your cath site, please call [phone].   Increase activity slowly    Complete by:  As directed       Discharge Medications   Current Discharge Medication List    CONTINUE these medications which have NOT CHANGED   Details  ALPRAZolam (XANAX) 1 MG tablet Take 1 tablet (1 mg total) by mouth 2 (two) times daily as needed. for anxiety Qty: 60 tablet, Refills: 2   Associated Diagnoses: Generalized anxiety disorder    aspirin EC 81 MG tablet Take 81 mg by mouth daily.    Blood Glucose Monitoring Suppl (ONE TOUCH ULTRA MINI) w/Device  KIT Use meter to check blood sugars 1-4 times daily as instructed. Qty: 1 each, Refills: 0    clopidogrel (PLAVIX) 75 MG tablet TAKE 1 TABLET BY MOUTH EVERY DAY WITH BREAKFAST Qty: 90 tablet, Refills: 3    fenofibrate 160 MG tablet TAKE 1 TABLET(160 MG) BY MOUTH DAILY Qty: 90 tablet, Refills: 0   Associated Diagnoses: Mixed hyperlipidemia    Insulin Glargine (LANTUS SOLOSTAR) 100 UNIT/ML Solostar Pen Inject 90 Units into the skin daily at 10 pm. Qty: 10 pen, Refills: 2    lisinopril-hydrochlorothiazide (PRINZIDE,ZESTORETIC) 20-25 MG tablet TAKE 1 TABLET BY MOUTH DAILY Qty: 90 tablet, Refills: 0   Associated Diagnoses: Essential hypertension, benign    metFORMIN (GLUCOPHAGE) 500 MG tablet TAKE 1 TABLET BY MOUTH TWICE DAILY Qty: 180 tablet, Refills: 0   Associated Diagnoses: Type 2 diabetes mellitus with other circulatory complication, with long-term current use of insulin (HCC)    nitroGLYCERIN (NITROSTAT) 0.4 MG SL tablet Place 1 tablet (0.4 mg total) under the tongue every 5 (five) minutes as needed for chest pain. Qty: 25 tablet, Refills: 2    ONE TOUCH ULTRA TEST test strip USE AS DIRECTED  1 TO 4 TIMES DAILY Qty: 100 each, Refills: 12    pantoprazole (PROTONIX) 40 MG tablet TAKE 1 TABLET(40 MG) BY MOUTH DAILY Qty: 90 tablet, Refills: 0    rosuvastatin (CRESTOR) 5 MG tablet Take 1 tablet (5 mg total) by mouth daily at 6 PM. Overdue for yearly physical w/labs must see MD for refills Qty: 30 tablet, Refills: 0   Associated Diagnoses: Type 2 diabetes mellitus with other circulatory complication, with long-term current use of insulin ([place]); Mixed hyperlipidemia    Tetrahydrozoline HCl (VISINE OP) Place 2 drops into both eyes daily as needed (itching).    vardenafil (LEVITRA) 10 MG tablet Take 1 tablet (10 mg total) by mouth daily as needed for erectile dysfunction. Do not take with nitroglycerin. Qty: 10 tablet, Refills: 0    Insulin Pen Needle 29G X 12.7MM MISC Use on insulin pen needles to administer insulin Qty: 100 each, Refills: 3          Outstanding Labs/Studies   None  Duration of Discharge Encounter   Greater than 30 minutes including physician time.  Signed, Daune Perch NP 01/22/2017, 9:54 AM  PT seen and examined earlier  Stable for discharge F?U arranged with S Hermina Barters

## 2017-01-22 NOTE — Progress Notes (Signed)
Progress Note  Patient Name: Carl Mccoy Date of Encounter: 01/22/2017  Primary Cardiologist: SM  Subjective   No CP  No SOB    Inpatient Medications    Scheduled Meds: . aspirin EC  81 mg Oral Daily  . clopidogrel  75 mg Oral Daily  . fenofibrate  160 mg Oral Daily  . lisinopril  20 mg Oral Daily   And  . hydrochlorothiazide  25 mg Oral Daily  . insulin aspart  0-15 Units Subcutaneous TID WC  . insulin glargine  90 Units Subcutaneous QHS  . pantoprazole  40 mg Oral Daily  . rosuvastatin  5 mg Oral q1800  . sodium chloride flush  3 mL Intravenous Q12H   Continuous Infusions: . sodium chloride     PRN Meds: sodium chloride, acetaminophen, nitroGLYCERIN, ondansetron (ZOFRAN) IV, sodium chloride flush   Vital Signs    Vitals:   01/21/17 1957 01/21/17 2000 01/22/17 0355 01/22/17 0752  BP: (!) 146/88  (!) 141/80 (!) 164/98  Pulse:   62 77  Resp: 18 18 18 16   Temp: 98 F (36.7 C)  98 F (36.7 C) 97.8 F (36.6 C)  TempSrc: Oral  Oral Oral  SpO2: 97% 98% 98% 97%  Weight:   260 lb 2.3 oz (118 kg)   Height:        Intake/Output Summary (Last 24 hours) at 01/22/17 0927 Last data filed at 01/22/17 0758  Gross per 24 hour  Intake              360 ml  Output             1500 ml  Net            -1140 ml   Filed Weights   01/21/17 1103 01/22/17 0355  Weight: 264 lb (119.7 kg) 260 lb 2.3 oz (118 kg)    Telemetry    SR - Personally Reviewed  ECG     Physical Exam   GEN: No acute distress.   Neck: No JVD Cardiac: RRR, no murmurs, rubs, or gallops.  Respiratory: Clear to auscultation bilaterally. GI: Soft, nontender, non-distended  MS  S/p R BKA   Neuro:  Nonfocal  Psych: Normal affect   Labs    Chemistry Recent Labs Lab 01/17/17 1242 01/22/17 0158  NA 136 136  K 3.6 3.8  CL 98* 104  CO2 27 26  GLUCOSE 269* 283*  BUN 20 22*  CREATININE 0.90 0.99  CALCIUM 9.1 8.6*  GFRNONAA >60 >60  GFRAA >60 >60  ANIONGAP 11 6     Hematology Recent  Labs Lab 01/17/17 1242 01/22/17 0158  WBC 5.6 5.6  RBC 4.64 4.38  HGB 13.4 12.3*  HCT 40.7 38.4*  MCV 87.7 87.7  MCH 28.9 28.1  MCHC 32.9 32.0  RDW 13.5 13.7  PLT 198 178    Cardiac EnzymesNo results for input(s): TROPONINI in the last 168 hours. No results for input(s): TROPIPOC in the last 168 hours.   BNPNo results for input(s): BNP, PROBNP in the last 168 hours.   DDimer No results for input(s): DDIMER in the last 168 hours.   Radiology    No results found.  Cardiac Studies   Procedures   CORONARY STENT INTERVENTION  LEFT HEART CATH AND CORONARY ANGIOGRAPHY  Conclusion     Ost RPDA lesion, 80 %stenosed.  Ost RCA to Prox RCA lesion, 20 %stenosed.  Ost LAD to Prox LAD lesion, 0 %stenosed.  Ost 1st  Diag to 1st Diag lesion, 40 %stenosed.  Mid RCA-1 lesion, 99 %stenosed.  Dist RCA-2 lesion, 90 %stenosed.  Dist RCA-1 lesion, 20 %stenosed.  2nd Mrg lesion, 40 %stenosed.  Mid LAD to Dist LAD lesion, 20 %stenosed.  A STENT RESOLUTE ONYX 2.75X26 drug eluting stent was successfully placed.  Mid RCA-2 lesion, 0 %stenosed.  A drug eluting .  Post intervention, there is a 0% residual stenosis.   1. Double vessel CAD 2. Severe restenosis mid RCA stented segment. This vessel has 2 layers of stent in the mid RCA where there is severe restenosis. (Promus and Synergy-both everolimus coated stents). Successful PTCA/DES x 1 mid RCA covering the old stent. A Resolute stent was placed.  3. Severe stenosis small PDA, unchanged from last cath.  4. Severe stenosis PLA. This vessel fills from left to right collaterals.   Recommendations: Continue DAPT with ASA and Plavix. Attempt to maximize cardiac medications.       Patient Profile     61 y.o. male hx of CAD (multple interventions, ), HTN and HL  Admtited with increasing angina and dypsnea    Assessment & Plan    1  CAD  S/p PTCA/DES to mid RCA   Areare wa restenosis   REmainder of vessels signif of severe  stenosis small PDA Unchagned  Severe stenosis PLA  Vessel fills by L to R collaterals    Plan for ASA and PLAVIX  2  HTN  BP is up  WIll need to be followed as an outpt     3  HL  ON Crestor  Pt wants to be seen in [place] or [place] as long as it is with Dr Domenic Polite For questions or updates, please contact [place] Please consult [email or web address] for contact info under Cardiology/STEMI.      Signed, Dorris Carnes, MD  01/22/2017, 9:27 AM

## 2017-01-22 NOTE — Progress Notes (Addendum)
CARDIAC REHAB PHASE I   PRE:  Pt in progress of ambulating independently  MODE:  Ambulation: 370 ft   POST:  Rate/Rhythm: 102 Sinus tach  BP:  Sitting: 160/65, 162/97 Primary RN aware to recheck bp prior to d/c   SaO2: 98  Pt already in process of ambulating in anticipation of going home today.  Wife called and let pt know she is on her way for pick up.  Education completed with pt. Pt with prior education with previous stents and admits he has not done all he could.  Pt with food choices and portion size issues and is planning to seek "help" from his primary MD.  Pt states he has taken medication to decrease appetite but this made him feel "wired" all the time.  Heart healthy nutrition with DM modification instruction and handout provided.  Pt given exercise guidelines and activity restrictions.  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  Pt is not interested in attending formal cardiac rehab.  Pt has right BKA and has difficulty moving his left leg.  Pt did not do any phase II on previous cardiac events.  Pt verified he has his stent card.  Handouts provided, questions answered. [phone]. Cherre Huger, BSN Cardiac and Training and development officer

## 2017-01-22 NOTE — Care Management Note (Signed)
Case Management Note  Patient Details  Name: LORN BUTCHER MRN: [id number] Date of Birth: 1955/05/13  Subjective/Objective:    From home,s/p coronary stent intervention,will be on plavix.                Action/Plan: NCM will follow for dc needs.  Expected Discharge Date:                  Expected Discharge Plan:  Home/Self Care  In-House Referral:     Discharge planning Services  CM Consult  Post Acute Care Choice:    Choice offered to:     DME Arranged:    DME Agency:     HH Arranged:    [place] Agency:     Status of Service:  Completed, signed off  If discussed at [employer] of Stay Meetings, dates discussed:    Additional Comments:  Zenon Mayo, RN 01/22/2017, 8:38 AM

## 2017-01-24 ENCOUNTER — Other Ambulatory Visit: Payer: Self-pay | Admitting: Family

## 2017-01-24 ENCOUNTER — Encounter (HOSPITAL_COMMUNITY): Payer: Self-pay | Admitting: Cardiovascular Disease

## 2017-01-28 ENCOUNTER — Ambulatory Visit: Payer: PPO | Admitting: Family

## 2017-02-01 ENCOUNTER — Other Ambulatory Visit: Payer: Self-pay | Admitting: Family

## 2017-02-01 DIAGNOSIS — I10 Essential (primary) hypertension: Secondary | ICD-10-CM

## 2017-02-01 DIAGNOSIS — E782 Mixed hyperlipidemia: Secondary | ICD-10-CM

## 2017-02-07 ENCOUNTER — Encounter: Payer: Self-pay | Admitting: Family

## 2017-02-07 ENCOUNTER — Ambulatory Visit (INDEPENDENT_AMBULATORY_CARE_PROVIDER_SITE_OTHER): Payer: PPO | Admitting: Family

## 2017-02-07 VITALS — BP 142/90 | HR 86 | Temp 98.1°F | Resp 16 | Ht 71.0 in | Wt 261.0 lb

## 2017-02-07 DIAGNOSIS — Z23 Encounter for immunization: Secondary | ICD-10-CM

## 2017-02-07 DIAGNOSIS — E1159 Type 2 diabetes mellitus with other circulatory complications: Secondary | ICD-10-CM | POA: Diagnosis not present

## 2017-02-07 DIAGNOSIS — F411 Generalized anxiety disorder: Secondary | ICD-10-CM | POA: Diagnosis not present

## 2017-02-07 DIAGNOSIS — Z794 Long term (current) use of insulin: Secondary | ICD-10-CM

## 2017-02-07 MED ORDER — ALPRAZOLAM 1 MG PO TABS: 1.0000 mg | ORAL_TABLET | Freq: Two times a day (BID) | ORAL | 2 refills | Status: AC | PRN

## 2017-02-07 MED ORDER — ONETOUCH ULTRA MINI W/DEVICE KIT: PACK | 0 refills | Status: AC

## 2017-02-07 MED ORDER — TRIAMCINOLONE ACETONIDE 0.1 % EX CREA: 1.0000 "application " | TOPICAL_CREAM | Freq: Two times a day (BID) | CUTANEOUS | 0 refills | Status: AC

## 2017-02-07 MED ORDER — INSULIN GLARGINE 100 UNIT/ML SOLOSTAR PEN: PEN_INJECTOR | SUBCUTANEOUS | 0 refills | Status: DC

## 2017-02-07 NOTE — Assessment & Plan Note (Signed)
Type 2 diabetes poorly controlled with patient indicating he is taking 160 units of Lantus per day with continued elevated blood sugars. Refer to endocrinology for further assessment given poor control with current medication regimen. Obtain updated A1c. Continue current dosage of metformin.

## 2017-02-07 NOTE — Patient Instructions (Addendum)
Thank you for choosing [facility].  SUMMARY AND INSTRUCTIONS:  Please continue to take your medications as prescribed.  A referral to endocrinology has been placed.   A new meter has been sent to your pharmacy.   Medication:  Your prescription(s) have been submitted to your pharmacy or been printed and provided for you. Please take as directed and contact our office if you believe you are having problem(s) with the medication(s) or have any questions.  Labs:  Please stop by the lab on the lower level of the building for your blood work. Your results will be released to [place] (or called to you) after review, usually within 72 hours after test completion. If any changes need to be made, you will be notified at that same time.  1.) The lab is open from 7:30am to 5:30 pm Monday-Friday 2.) No appointment is necessary 3.) Fasting (if needed) is 6-8 hours after food and drink; black coffee and water are okay   Follow up:  If your symptoms worsen or fail to improve, please contact our office for further instruction, or in case of emergency go directly to the emergency room at the closest medical facility.
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
## 2017-02-07 NOTE — Assessment & Plan Note (Signed)
Anxiety appears adequately controlled with current medication regimen and no adverse side effects. NCCSD reviewed with no irregularities. Continue current dosage of alprazolam.

## 2017-02-09 ENCOUNTER — Ambulatory Visit: Payer: PPO | Admitting: Cardiology
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
## 2017-02-23 ENCOUNTER — Ambulatory Visit (INDEPENDENT_AMBULATORY_CARE_PROVIDER_SITE_OTHER): Payer: PPO | Admitting: Cardiology

## 2017-02-23 ENCOUNTER — Encounter: Payer: Self-pay | Admitting: Cardiology

## 2017-02-23 VITALS — BP 146/88 | HR 69 | Ht 71.0 in | Wt 264.2 lb

## 2017-02-23 DIAGNOSIS — I25119 Atherosclerotic heart disease of native coronary artery with unspecified angina pectoris: Secondary | ICD-10-CM

## 2017-02-23 DIAGNOSIS — E1165 Type 2 diabetes mellitus with hyperglycemia: Secondary | ICD-10-CM

## 2017-02-23 DIAGNOSIS — E782 Mixed hyperlipidemia: Secondary | ICD-10-CM

## 2017-02-23 DIAGNOSIS — I10 Essential (primary) hypertension: Secondary | ICD-10-CM

## 2017-02-23 NOTE — Patient Instructions (Signed)

## 2017-03-08 ENCOUNTER — Other Ambulatory Visit: Payer: Self-pay

## 2017-03-08 MED ORDER — PANTOPRAZOLE SODIUM 40 MG PO TBEC: DELAYED_RELEASE_TABLET | ORAL | 0 refills | Status: DC

## 2017-03-10 ENCOUNTER — Other Ambulatory Visit: Payer: Self-pay | Admitting: *Deleted

## 2017-03-10 MED ORDER — PANTOPRAZOLE SODIUM 40 MG PO TBEC: DELAYED_RELEASE_TABLET | ORAL | 0 refills | Status: DC

## 2017-04-05 ENCOUNTER — Other Ambulatory Visit: Payer: Self-pay | Admitting: *Deleted

## 2017-04-05 DIAGNOSIS — E782 Mixed hyperlipidemia: Secondary | ICD-10-CM

## 2017-04-05 DIAGNOSIS — E1159 Type 2 diabetes mellitus with other circulatory complications: Secondary | ICD-10-CM

## 2017-04-05 DIAGNOSIS — Z794 Long term (current) use of insulin: Principal | ICD-10-CM

## 2017-04-05 MED ORDER — ROSUVASTATIN CALCIUM 5 MG PO TABS: 5.0000 mg | ORAL_TABLET | Freq: Every day | ORAL | 0 refills | Status: DC

## 2017-04-05 MED ORDER — INSULIN GLARGINE 100 UNIT/ML SOLOSTAR PEN: PEN_INJECTOR | SUBCUTANEOUS | 0 refills | Status: DC

## 2017-04-08 ENCOUNTER — Encounter: Payer: Self-pay | Admitting: Nurse Practitioner

## 2017-04-08 ENCOUNTER — Ambulatory Visit (INDEPENDENT_AMBULATORY_CARE_PROVIDER_SITE_OTHER): Payer: PPO | Admitting: Nurse Practitioner

## 2017-04-08 VITALS — BP 160/84 | HR 92 | Temp 98.1°F | Resp 16 | Ht 71.0 in | Wt 259.0 lb

## 2017-04-08 DIAGNOSIS — Z794 Long term (current) use of insulin: Secondary | ICD-10-CM | POA: Diagnosis not present

## 2017-04-08 DIAGNOSIS — E1159 Type 2 diabetes mellitus with other circulatory complications: Secondary | ICD-10-CM

## 2017-04-08 DIAGNOSIS — I10 Essential (primary) hypertension: Secondary | ICD-10-CM | POA: Diagnosis not present

## 2017-04-08 DIAGNOSIS — F411 Generalized anxiety disorder: Secondary | ICD-10-CM | POA: Diagnosis not present

## 2017-04-08 DIAGNOSIS — M25569 Pain in unspecified knee: Secondary | ICD-10-CM | POA: Diagnosis not present

## 2017-04-08 DIAGNOSIS — E785 Hyperlipidemia, unspecified: Secondary | ICD-10-CM

## 2017-04-08 DIAGNOSIS — E782 Mixed hyperlipidemia: Secondary | ICD-10-CM

## 2017-04-08 MED ORDER — ESCITALOPRAM OXALATE 10 MG PO TABS: 10.0000 mg | ORAL_TABLET | Freq: Every day | ORAL | 2 refills | Status: DC

## 2017-04-08 MED ORDER — INSULIN GLARGINE 100 UNIT/ML SOLOSTAR PEN: PEN_INJECTOR | SUBCUTANEOUS | 0 refills | Status: DC

## 2017-04-08 NOTE — Assessment & Plan Note (Addendum)
Readings above goal 140/90 in office. He thinks his readings go up in healthcare settings, but he does not check them at home. He is resistant to adjusting medications. He is currently maintained on lisinopril-hctz which we will maintain at this time. He will return in 1 month and we will readress his BP if he is willing. Healthy diet and exercise discussed - Ambulatory referral to Ophthalmology - Basic metabolic panel; Future

## 2017-04-08 NOTE — Assessment & Plan Note (Signed)
Maintained on crestor, fenofibrate. Reports compliance.  No recent lipid panel on file. He says he will come to lab next week when fasting for lipid panel. - Lipid panel.

## 2017-04-08 NOTE — Assessment & Plan Note (Signed)
Maintained on lantus and metformin. He did run out of medications for some time but recently re-started them. He is requesting 3 month supply of lantus to increase compliance, which we have sent today. - Ambulatory referral to Ophthalmology - Basic metabolic panel; Future - Hemoglobin A1c; Future
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
## 2017-04-08 NOTE — Patient Instructions (Signed)
PLEASE return for lab work when you are fasting.  I have sent a new prescription for lexapro 10 mg daily to help with your mood.  Please start 1/2 tablet once daily for 1 week and then increase to a full tablet once daily on week two as tolerated.  Rarely, people have experienced suicidal thoughts when starting this medicatoin.  Please discontinue the medication and go directly to ED if this occurs.  Id like to see you back in about 1 month to see how you are doing.  I have placed a referral to ophthalmology for your eye exam. Our office will call you to schedule this appointment. You should hear from our office in 7-10 days.  It was nice to meet you. Thanks for letting me take care of you today :)

## 2017-04-08 NOTE — Assessment & Plan Note (Signed)
Maintained on xanax BID with continued anxiety. We discussed long term risks of daily xanax dosing He is willing to try daily SSRI and consider possibility of weaning xanax in 1 month.  - escitalopram (LEXAPRO) 10 MG tablet; Take 1 tablet (10 mg total) by mouth daily.  Dispense: 30 tablet; Refill: 2 Side effects including Suicidality discussed, he will D/c medication and go immediately to ER if this occurs.  RTC in 1 month for recheck

## 2017-04-28 ENCOUNTER — Other Ambulatory Visit: Payer: Self-pay

## 2017-04-28 DIAGNOSIS — E782 Mixed hyperlipidemia: Secondary | ICD-10-CM

## 2017-04-28 MED ORDER — PANTOPRAZOLE SODIUM 40 MG PO TBEC: DELAYED_RELEASE_TABLET | ORAL | 0 refills | Status: DC

## 2017-05-13 ENCOUNTER — Telehealth: Payer: Self-pay | Admitting: Nurse Practitioner

## 2017-05-13 DIAGNOSIS — Z794 Long term (current) use of insulin: Secondary | ICD-10-CM

## 2017-05-13 DIAGNOSIS — I10 Essential (primary) hypertension: Secondary | ICD-10-CM

## 2017-05-13 DIAGNOSIS — E782 Mixed hyperlipidemia: Secondary | ICD-10-CM

## 2017-05-13 DIAGNOSIS — E1159 Type 2 diabetes mellitus with other circulatory complications: Secondary | ICD-10-CM

## 2017-05-13 MED ORDER — LISINOPRIL-HYDROCHLOROTHIAZIDE 20-25 MG PO TABS: 1.0000 | ORAL_TABLET | Freq: Every day | ORAL | 0 refills | Status: DC

## 2017-05-13 NOTE — Telephone Encounter (Signed)
Copied from [place] [id number]. Topic: Quick Communication - See Telephone Encounter >> May 13, 2017  4:06 PM Oneta Rack wrote: CRM for notification. See Telephone encounter for:   05/13/17.  Caller name: Bignell,Rhonda Relation to pt: spouse  Call back number: [phone] Pharmacy: [facility] [address] [place] [phone] (Phone) [phone] (Fax)    Reason for call:  Patient states pharmacy faxed over for 1 week medication refill request for lisinopril-hydrochlorothiazide (PRINZIDE,ZESTORETIC) 20-25 MG tablet ,  fenofibrate 160 MG tablet , rosuvastatin (CRESTOR) 5 MG tablet, patient states he's completely out, please advise

## 2017-05-16 ENCOUNTER — Telehealth: Payer: Self-pay | Admitting: Nurse Practitioner

## 2017-05-16 DIAGNOSIS — Z89511 Acquired absence of right leg below knee: Secondary | ICD-10-CM

## 2017-05-16 MED ORDER — FENOFIBRATE 160 MG PO TABS: ORAL_TABLET | ORAL | 0 refills | Status: DC

## 2017-05-16 MED ORDER — ROSUVASTATIN CALCIUM 5 MG PO TABS: 5.0000 mg | ORAL_TABLET | Freq: Every day | ORAL | 0 refills | Status: DC

## 2017-05-16 NOTE — Telephone Encounter (Signed)
Spoke with pt and advised that he NEEDS to come in for cholesterol labs it has been well over a year since he had a lipid panel done. Pt states that he works outside [employer] and it is hard for him to get off work but he will try to get by here when he can. I let him know a 30 day supply has been sent to pharmacy until he is able to get by here.

## 2017-05-16 NOTE — Telephone Encounter (Signed)
Additional CRM Note: Patient called very upset about his medication and not getting them refill on time. He wishes to talk to [place] about this. Please call patient back, thanks.

## 2017-05-16 NOTE — Telephone Encounter (Signed)
Copied from [place] [id number]. Topic: Quick Communication - See Telephone Encounter >> May 16, 2017  2:46 PM Conception Chancy, NT wrote: CRM for notification. See Telephone encounter for:  05/16/17.  Patient is requesting a prescription for sheaves and prosthetic socks for his right leg sent into [facility] on [address]. Please advise.

## 2017-05-19 NOTE — Telephone Encounter (Signed)
Called pt to verify msg below " sheaves". Pt states he need the sheaves to go over his leg. I called biotech to verify what pt was needing. Pt is needing a Sleeves Prosthetic Sock  Which is a prosthetic liners  to improve amputee safety and comfort. It have a little cushion in it that is layer between the residual limb and the prosthetic socke.  Verfied fax # [phone]. Per Hollie [place] ok to faxed printed DME gave to Ashleigh to sign...[name]

## 2017-06-10 ENCOUNTER — Ambulatory Visit: Payer: PPO | Admitting: Endocrinology

## 2017-06-17 ENCOUNTER — Other Ambulatory Visit (INDEPENDENT_AMBULATORY_CARE_PROVIDER_SITE_OTHER): Payer: PPO

## 2017-06-17 ENCOUNTER — Encounter: Payer: Self-pay | Admitting: Family Medicine

## 2017-06-17 ENCOUNTER — Ambulatory Visit (INDEPENDENT_AMBULATORY_CARE_PROVIDER_SITE_OTHER): Payer: PPO | Admitting: Family Medicine

## 2017-06-17 ENCOUNTER — Other Ambulatory Visit: Payer: Self-pay | Admitting: Family Medicine

## 2017-06-17 ENCOUNTER — Ambulatory Visit: Payer: PPO | Admitting: Endocrinology

## 2017-06-17 VITALS — BP 136/76 | HR 102 | Temp 98.7°F | Ht 71.0 in | Wt 251.0 lb

## 2017-06-17 DIAGNOSIS — E1159 Type 2 diabetes mellitus with other circulatory complications: Secondary | ICD-10-CM

## 2017-06-17 DIAGNOSIS — E782 Mixed hyperlipidemia: Secondary | ICD-10-CM

## 2017-06-17 DIAGNOSIS — Z794 Long term (current) use of insulin: Secondary | ICD-10-CM

## 2017-06-17 DIAGNOSIS — Z978 Presence of other specified devices: Secondary | ICD-10-CM | POA: Diagnosis not present

## 2017-06-17 DIAGNOSIS — Z0289 Encounter for other administrative examinations: Secondary | ICD-10-CM

## 2017-06-17 DIAGNOSIS — F411 Generalized anxiety disorder: Secondary | ICD-10-CM

## 2017-06-17 DIAGNOSIS — I10 Essential (primary) hypertension: Secondary | ICD-10-CM

## 2017-06-17 LAB — CBC
HCT: 41.1 % (ref 39.0–52.0)
HEMOGLOBIN: 14.2 g/dL (ref 13.0–17.0)
MCHC: 34.5 g/dL (ref 30.0–36.0)
MCV: 84.1 fl (ref 78.0–100.0)
Platelets: 247 10*3/uL (ref 150.0–400.0)
RBC: 4.89 Mil/uL (ref 4.22–5.81)
RDW: 13.4 % (ref 11.5–15.5)
WBC: 6.8 10*3/uL (ref 4.0–10.5)

## 2017-06-17 LAB — LDL CHOLESTEROL, DIRECT: Direct LDL: 70 mg/dL

## 2017-06-17 LAB — LIPID PANEL
CHOL/HDL RATIO: 6
Cholesterol: 122 mg/dL (ref 0–200)
HDL: 19.4 mg/dL — ABNORMAL LOW (ref >39.00)
NONHDL: 102.91
Triglycerides: 340 mg/dL — ABNORMAL HIGH (ref 0.0–149.0)
VLDL: 68 mg/dL — AB (ref 0.0–40.0)

## 2017-06-17 LAB — HEMOGLOBIN A1C: Hgb A1c MFr Bld: 9 % — ABNORMAL HIGH (ref 4.6–6.5)

## 2017-06-17 MED ORDER — INSULIN GLARGINE 100 UNIT/ML SOLOSTAR PEN: PEN_INJECTOR | SUBCUTANEOUS | 0 refills | Status: AC

## 2017-06-17 MED ORDER — LISINOPRIL-HYDROCHLOROTHIAZIDE 20-25 MG PO TABS: 1.0000 | ORAL_TABLET | Freq: Every day | ORAL | 0 refills | Status: AC

## 2017-06-17 MED ORDER — PANTOPRAZOLE SODIUM 40 MG PO TBEC: DELAYED_RELEASE_TABLET | ORAL | 0 refills | Status: AC

## 2017-06-17 MED ORDER — METFORMIN HCL 500 MG PO TABS: 500.0000 mg | ORAL_TABLET | Freq: Two times a day (BID) | ORAL | 0 refills | Status: DC

## 2017-06-17 MED ORDER — ROSUVASTATIN CALCIUM 5 MG PO TABS: 5.0000 mg | ORAL_TABLET | Freq: Every day | ORAL | 0 refills | Status: DC

## 2017-06-17 MED ORDER — ESCITALOPRAM OXALATE 10 MG PO TABS: 10.0000 mg | ORAL_TABLET | Freq: Every day | ORAL | 0 refills | Status: AC
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
## 2017-06-17 NOTE — Assessment & Plan Note (Signed)
Continue out his paperwork as needed in order to complete the order for biotech

## 2017-06-17 NOTE — Assessment & Plan Note (Signed)
Controlled today. - Refilled medication

## 2017-06-17 NOTE — Assessment & Plan Note (Signed)
Uncontrolled - A1c today - Refilled metformin and Lantus. These may need to be changed based on his results.

## 2017-06-17 NOTE — Patient Instructions (Addendum)
We will call you with the results from today Your equipment for a prosthesis was faxed on 1/14. Please let us know if this was not received a week and fax it again.

## 2017-06-17 NOTE — Assessment & Plan Note (Signed)
Refilled statin. - Lipid panel today

## 2017-06-20 ENCOUNTER — Telehealth: Payer: Self-pay | Admitting: Family Medicine

## 2017-06-20 NOTE — Telephone Encounter (Signed)
Spoke with patient about his lab results. His triglycerides remain elevated and counseled on diet and exercise. He can continue on his fenofibrate. His A1c increased to 9.0. Counseled to increase his Lantus 1 unit per day until his morning blood sugar is below 130. May need to increase his metformin to 1000 mg BID.   Office visit completed on Friday was to get his prosthesis reordered and sent to biotech.   Rosemarie Ax, MD [facility] 06/20/2017, 10:10 AM

## 2017-06-21 ENCOUNTER — Other Ambulatory Visit: Payer: Self-pay

## 2017-06-21 DIAGNOSIS — Z978 Presence of other specified devices: Secondary | ICD-10-CM

## 2017-06-22 ENCOUNTER — Other Ambulatory Visit: Payer: Self-pay

## 2017-06-22 DIAGNOSIS — E782 Mixed hyperlipidemia: Secondary | ICD-10-CM

## 2017-06-22 MED ORDER — FENOFIBRATE 160 MG PO TABS: ORAL_TABLET | ORAL | 0 refills | Status: DC

## 2017-06-29 ENCOUNTER — Other Ambulatory Visit: Payer: Self-pay

## 2017-06-29 DIAGNOSIS — E1159 Type 2 diabetes mellitus with other circulatory complications: Secondary | ICD-10-CM

## 2017-06-29 DIAGNOSIS — Z794 Long term (current) use of insulin: Principal | ICD-10-CM

## 2017-06-29 MED ORDER — METFORMIN HCL 500 MG PO TABS: 500.0000 mg | ORAL_TABLET | Freq: Two times a day (BID) | ORAL | 0 refills | Status: AC

## 2017-07-04 ENCOUNTER — Telehealth: Payer: Self-pay | Admitting: Nurse Practitioner

## 2017-07-04 NOTE — Telephone Encounter (Signed)
Copied from [place]. Topic: Quick Communication - See Telephone Encounter >> Jul 04, 2017 10:16 AM Bea Graff, NT wrote: CRM for notification. See Telephone encounter for: Benita Gutter with [facility] is needing a nurse to call him so he can obtain som information to get his pt approved for prosthetic devices for his below the knee amputation. CB#: [phone]  07/04/17.

## 2017-07-04 NOTE — Telephone Encounter (Signed)
Spoke with Randal and he is going to send an email of information needed to process order for prosthetic devices. Will run information by Raeford Razor as soon as possible.

## 2017-07-04 NOTE — Telephone Encounter (Signed)
Called Randall back in regards. LVM for him to call back.

## 2017-07-05 NOTE — Telephone Encounter (Signed)
Orders had to be resubmitted. Colletta Maryland from [employer] states she will resubmit orders. I have had Dr. Raeford Razor resign orders and have faxed signed orders back to stephanie to take care of.

## 2017-07-12 ENCOUNTER — Ambulatory Visit (INDEPENDENT_AMBULATORY_CARE_PROVIDER_SITE_OTHER): Payer: PPO | Admitting: Orthopaedic Surgery

## 2017-07-12 ENCOUNTER — Encounter (INDEPENDENT_AMBULATORY_CARE_PROVIDER_SITE_OTHER): Payer: Self-pay | Admitting: Orthopaedic Surgery

## 2017-07-12 DIAGNOSIS — Z89511 Acquired absence of right leg below knee: Secondary | ICD-10-CM | POA: Diagnosis not present
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
## 2017-07-24 ENCOUNTER — Other Ambulatory Visit: Payer: Self-pay | Admitting: Family Medicine

## 2017-07-24 DIAGNOSIS — Z794 Long term (current) use of insulin: Principal | ICD-10-CM

## 2017-07-24 DIAGNOSIS — E1159 Type 2 diabetes mellitus with other circulatory complications: Secondary | ICD-10-CM

## 2017-07-24 DIAGNOSIS — E782 Mixed hyperlipidemia: Secondary | ICD-10-CM

## 2017-08-10 DIAGNOSIS — Z89511 Acquired absence of right leg below knee: Secondary | ICD-10-CM | POA: Diagnosis not present

## 2017-08-12 ENCOUNTER — Other Ambulatory Visit: Payer: Self-pay | Admitting: Family Medicine

## 2017-08-12 DIAGNOSIS — E782 Mixed hyperlipidemia: Secondary | ICD-10-CM

## 2017-08-12 DIAGNOSIS — Z794 Long term (current) use of insulin: Principal | ICD-10-CM

## 2017-08-12 DIAGNOSIS — E1159 Type 2 diabetes mellitus with other circulatory complications: Secondary | ICD-10-CM

## 2017-08-19 ENCOUNTER — Telehealth: Payer: Self-pay | Admitting: Nurse Practitioner

## 2017-08-19 NOTE — Telephone Encounter (Addendum)
Contacted pt regarding refill request for crestor; per pharmacy note dated 06/17/17 "pt needs labs for more refills"; pt states that labs were done less than 3 months ago; explained to pt the need to have labs specific to this medication need to be drawn; also explained to the pt that no appointment is necessary for labs and gave him the hours of operation 0730-1730; he would like this to be       routed to the provider for review.

## 2017-08-19 NOTE — Telephone Encounter (Signed)
Copied from [place] [id number]. Topic: Quick Communication - Rx Refill/Question >> Aug 19, 2017  1:19 PM Cleaster Corin, [place] wrote: Medication:rosuvastatin (CRESTOR) 5 MG tablet [[id number]]  Has the patient contacted their pharmacy? yes (Agent: If no, request that the patient contact the pharmacy for the refill.) Preferred Pharmacy (with phone number or street name): [facility] [address], [place] [place] [address] Phone: [phone] Fax: [phone]   Agent: Please be advised that RX refills may take up to 3 business days. We ask that you follow-up with your pharmacy.

## 2017-08-22 ENCOUNTER — Other Ambulatory Visit: Payer: Self-pay

## 2017-08-22 DIAGNOSIS — Z794 Long term (current) use of insulin: Principal | ICD-10-CM

## 2017-08-22 DIAGNOSIS — E782 Mixed hyperlipidemia: Secondary | ICD-10-CM

## 2017-08-22 DIAGNOSIS — E1159 Type 2 diabetes mellitus with other circulatory complications: Secondary | ICD-10-CM

## 2017-08-22 MED ORDER — ROSUVASTATIN CALCIUM 5 MG PO TABS: 5.0000 mg | ORAL_TABLET | Freq: Every day | ORAL | 0 refills | Status: AC

## 2017-08-22 NOTE — Telephone Encounter (Signed)
Called pt and advised he does not need to come in for updated labs as they were just done in February. Rx has been sent in for 90 day supply. Pt is aware.

## 2017-09-01 IMAGING — DX DG CHEST 2V
2 series · 2 of 2 positions shown · non-contrast
Comparison: PA and lateral chest x-ray February 14, 2015

CLINICAL DATA: Preoperative examination prior to cardiac
catheterization. History of coronary artery disease, hypertension,
diabetes, former smoker.

EXAM:
CHEST  2 VIEW

[chest pa]
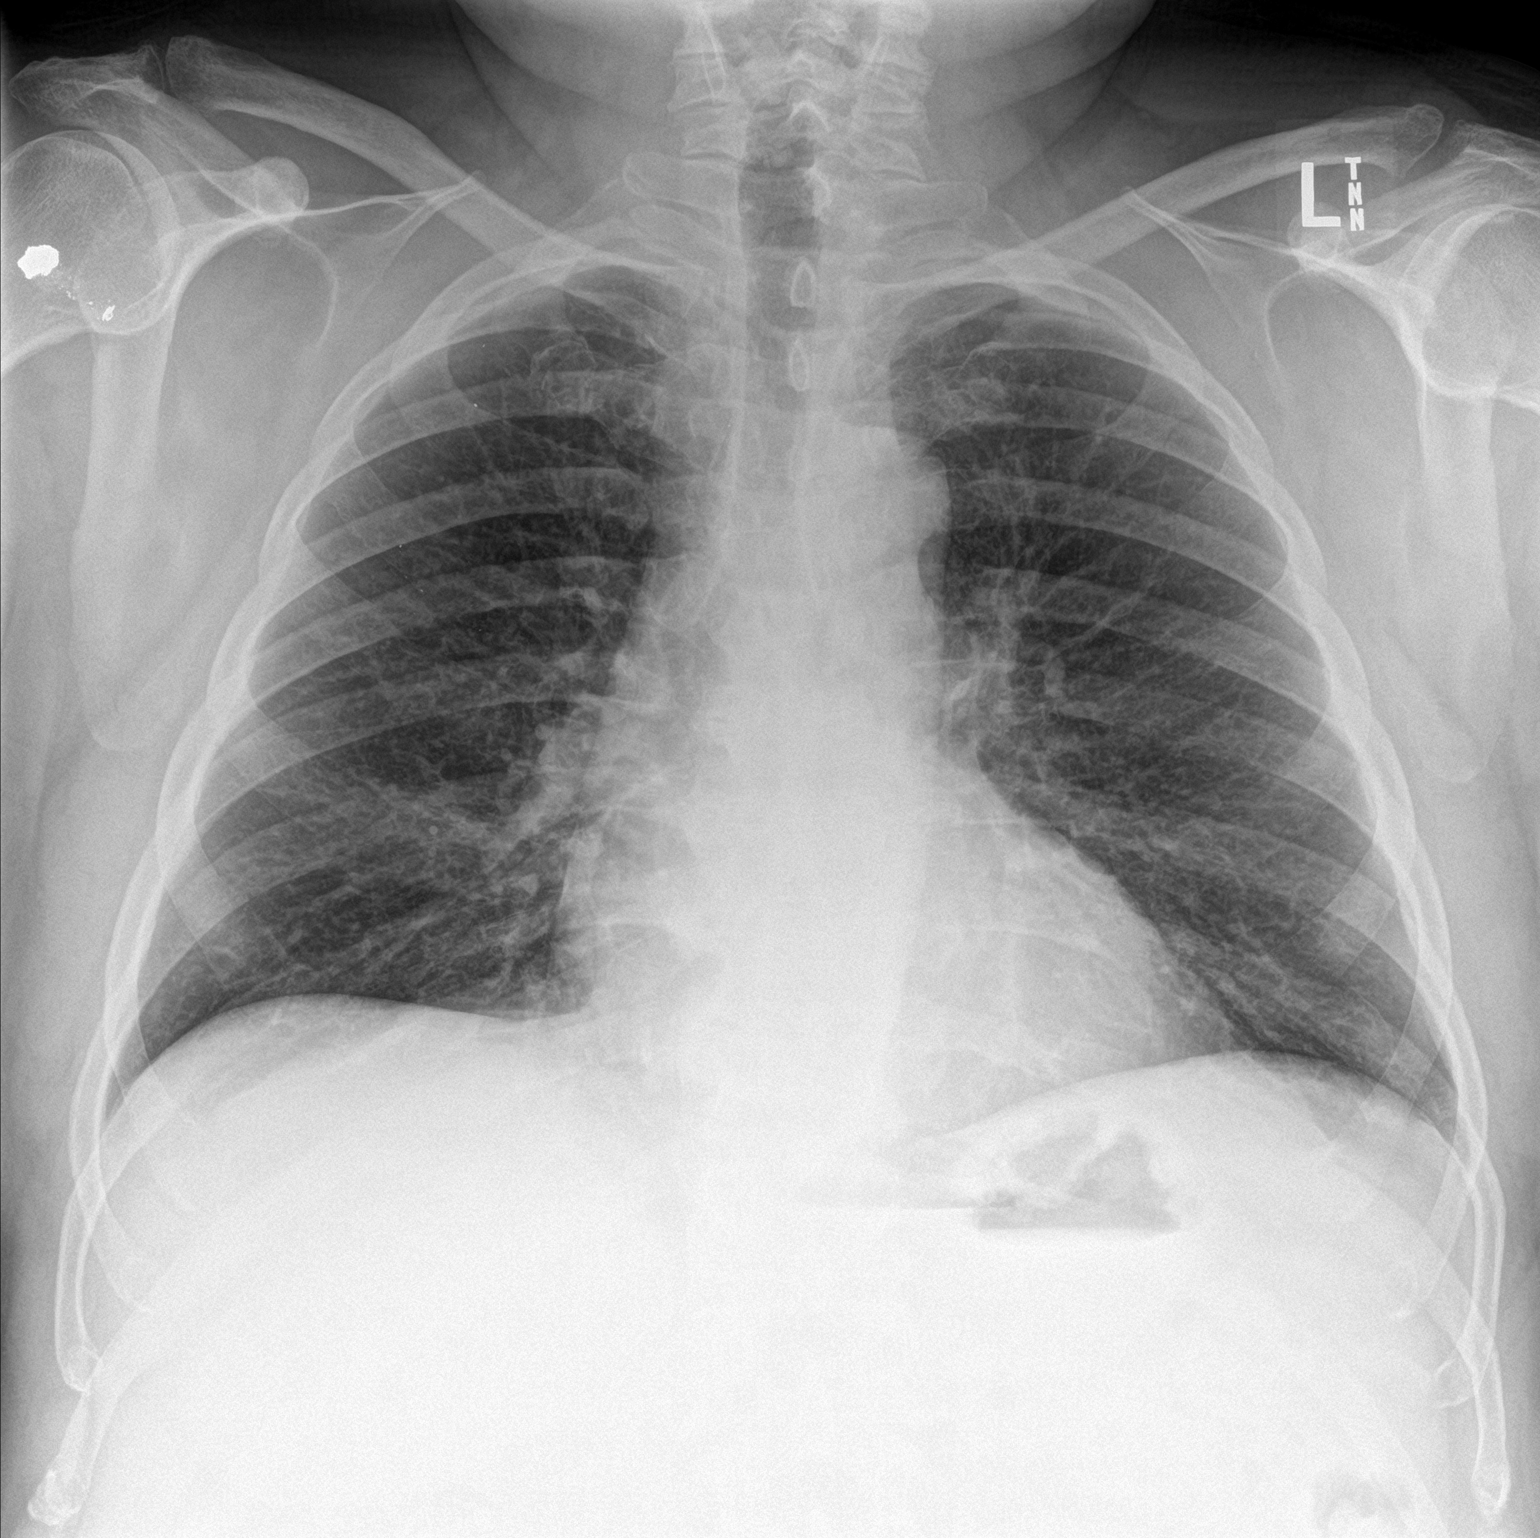

[chest lat]
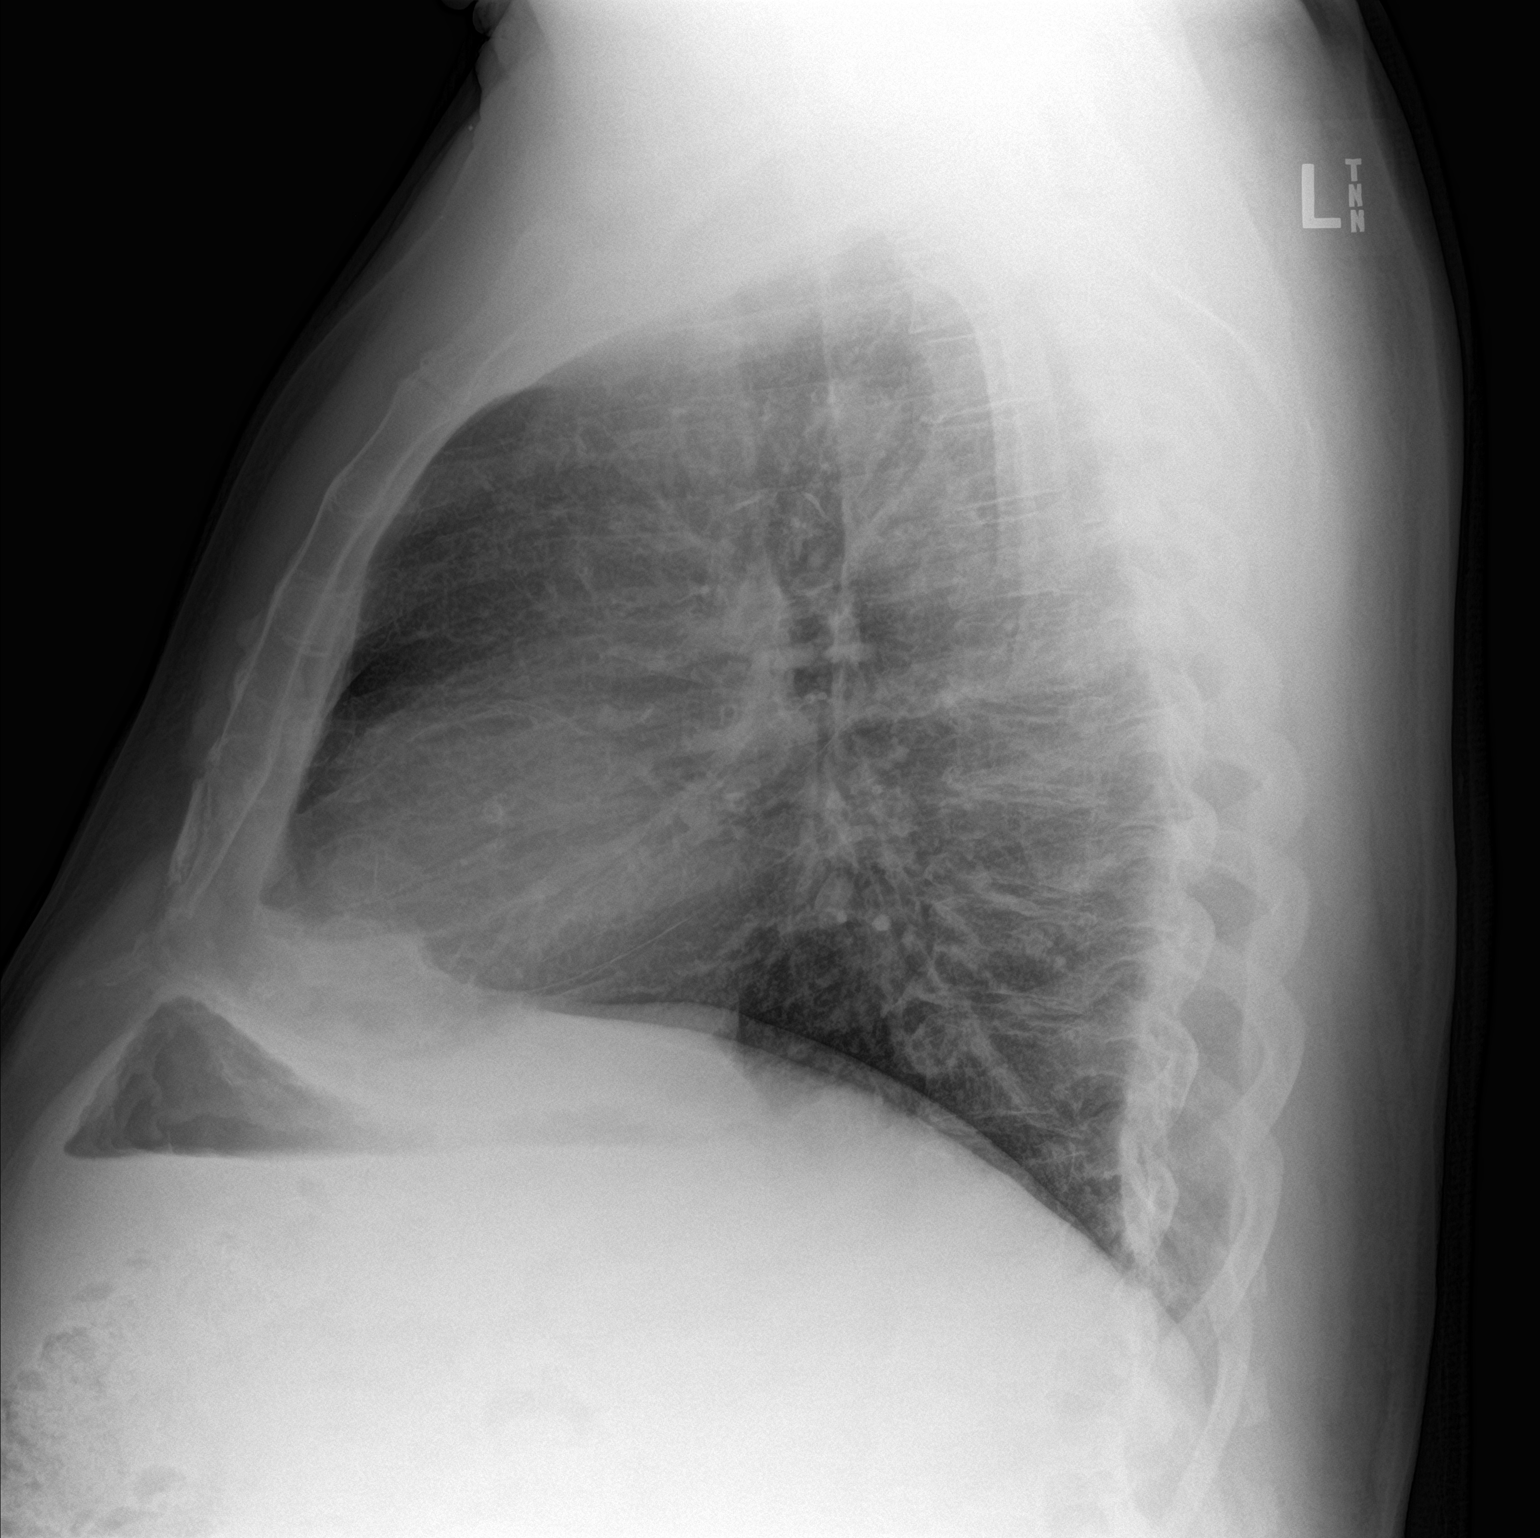

[2 of 2 positions shown; findings below may reference images not displayed]

FINDINGS: The lungs are well-expanded. The interstitial markings are coarse
though stable. There is no alveolar infiltrate or pleural effusion.
The heart and pulmonary vascularity are normal. The mediastinum is
normal in width. The bony thorax is unremarkable.
IMPRESSION: There is no acute cardiopulmonary abnormality.

## 2017-09-12 ENCOUNTER — Other Ambulatory Visit: Payer: Self-pay

## 2017-09-12 DIAGNOSIS — E782 Mixed hyperlipidemia: Secondary | ICD-10-CM

## 2017-09-12 MED ORDER — FENOFIBRATE 160 MG PO TABS: ORAL_TABLET | ORAL | 1 refills | Status: AC

## 2017-09-19 ENCOUNTER — Telehealth: Payer: Self-pay

## 2017-09-19 NOTE — Telephone Encounter (Signed)
On 09/19/17 I received a d/c from [facility] (original). The d/c is for cremation.  The patient is a patient of Doctor [employer].  The d/c will be taken to [facility] for signature.  On 09/20/17 I received the d/c back form Doctor [facility].  I got the d/c ready and called the funeral home to let them know I mailed the d/c to [facility]) per their request.

## 2017-10-01 DEATH — deceased
